# Patient Record
Sex: Female | Born: 1960 | Race: White | Hispanic: No | Marital: Single | Smoking: Never smoker
Health system: Southern US, Community
[De-identification: ages and names within clinical notes are randomized; demographics above are authoritative.]

## PROBLEM LIST (undated history)

## (undated) DIAGNOSIS — D649 Anemia, unspecified: Secondary | ICD-10-CM

## (undated) DIAGNOSIS — I10 Essential (primary) hypertension: Secondary | ICD-10-CM

## (undated) DIAGNOSIS — K219 Gastro-esophageal reflux disease without esophagitis: Secondary | ICD-10-CM

## (undated) HISTORY — DX: Anemia, unspecified: D64.9

## (undated) HISTORY — DX: Essential (primary) hypertension: I10

---

## 1989-01-30 HISTORY — PX: LUNG SURGERY: SHX703

## 2003-01-31 HISTORY — PX: ABDOMINAL HYSTERECTOMY: SHX81

## 2004-01-31 HISTORY — PX: BUNIONECTOMY: SHX129

## 2009-11-23 ENCOUNTER — Ambulatory Visit (HOSPITAL_COMMUNITY): Admission: RE | Admit: 2009-11-23 | Discharge: 2009-11-24 | Payer: Self-pay | Admitting: Neurosurgery

## 2010-01-30 HISTORY — PX: NECK SURGERY: SHX720

## 2010-04-13 LAB — SURGICAL PCR SCREEN
MRSA, PCR: NEGATIVE
Staphylococcus aureus: NEGATIVE

## 2010-04-13 LAB — BASIC METABOLIC PANEL
BUN: 9 mg/dL (ref 6–23)
CO2: 28 mEq/L (ref 19–32)
Calcium: 9.9 mg/dL (ref 8.4–10.5)
Chloride: 103 mEq/L (ref 96–112)
Creatinine, Ser: 0.75 mg/dL (ref 0.4–1.2)
GFR calc Af Amer: >60 mL/min (ref >60)
GFR calc non Af Amer: >60 mL/min (ref >60)
Glucose, Bld: 93 mg/dL (ref 70–99)
Potassium: 4.4 mEq/L (ref 3.5–5.1)
Sodium: 138 mEq/L (ref 135–145)

## 2010-04-13 LAB — CBC
HCT: 38 % (ref 36.0–46.0)
Hemoglobin: 12.9 g/dL (ref 12.0–15.0)
MCH: 29.3 pg (ref 26.0–34.0)
MCHC: 33.9 g/dL (ref 30.0–36.0)
MCV: 86.4 fL (ref 78.0–100.0)
Platelets: 266 10*3/uL (ref 150–400)
RBC: 4.4 MIL/uL (ref 3.87–5.11)
RDW: 13.1 % (ref 11.5–15.5)
WBC: 7 10*3/uL (ref 4.0–10.5)

## 2010-08-31 ENCOUNTER — Other Ambulatory Visit (HOSPITAL_COMMUNITY): Payer: Self-pay | Admitting: Neurosurgery

## 2010-08-31 DIAGNOSIS — M5020 Other cervical disc displacement, unspecified cervical region: Secondary | ICD-10-CM

## 2010-09-08 ENCOUNTER — Ambulatory Visit (HOSPITAL_COMMUNITY)
Admission: RE | Admit: 2010-09-08 | Discharge: 2010-09-08 | Disposition: A | Discharge status: Home / Self Care | Payer: Self-pay | Source: Ambulatory Visit | Attending: Neurosurgery | Admitting: Neurosurgery

## 2010-09-08 DIAGNOSIS — M5020 Other cervical disc displacement, unspecified cervical region: Secondary | ICD-10-CM | POA: Insufficient documentation

## 2010-09-21 ENCOUNTER — Encounter (HOSPITAL_COMMUNITY)
Admission: RE | Admit: 2010-09-21 | Discharge: 2010-09-21 | Disposition: A | Discharge status: Home / Self Care | Payer: BC Managed Care – PPO | Source: Ambulatory Visit | Attending: Neurosurgery | Admitting: Neurosurgery

## 2010-09-21 LAB — BASIC METABOLIC PANEL
BUN: 14 mg/dL (ref 6–23)
CO2: 30 mEq/L (ref 19–32)
Calcium: 9.5 mg/dL (ref 8.4–10.5)
Chloride: 103 mEq/L (ref 96–112)
Creatinine, Ser: 0.7 mg/dL (ref 0.50–1.10)
GFR calc Af Amer: >60 mL/min (ref >60)
GFR calc non Af Amer: >60 mL/min (ref >60)
Glucose, Bld: 89 mg/dL (ref 70–99)
Potassium: 4.7 mEq/L (ref 3.5–5.1)
Sodium: 139 mEq/L (ref 135–145)

## 2010-09-21 LAB — CBC
HCT: 38.5 % (ref 36.0–46.0)
Hemoglobin: 13.2 g/dL (ref 12.0–15.0)
MCH: 29.5 pg (ref 26.0–34.0)
MCHC: 34.3 g/dL (ref 30.0–36.0)
MCV: 86.1 fL (ref 78.0–100.0)
Platelets: 256 10*3/uL (ref 150–400)
RBC: 4.47 MIL/uL (ref 3.87–5.11)
RDW: 13.5 % (ref 11.5–15.5)
WBC: 7 10*3/uL (ref 4.0–10.5)

## 2010-09-21 LAB — SURGICAL PCR SCREEN
MRSA, PCR: NEGATIVE
Staphylococcus aureus: NEGATIVE

## 2010-09-30 ENCOUNTER — Ambulatory Visit (HOSPITAL_COMMUNITY): Payer: BC Managed Care – PPO

## 2010-09-30 ENCOUNTER — Ambulatory Visit (HOSPITAL_COMMUNITY)
Admission: RE | Admit: 2010-09-30 | Discharge: 2010-10-02 | DRG: 865 | Disposition: A | Discharge status: Home / Self Care | Payer: BC Managed Care – PPO | Source: Ambulatory Visit | Attending: Neurosurgery | Admitting: Neurosurgery

## 2010-09-30 DIAGNOSIS — Z01812 Encounter for preprocedural laboratory examination: Secondary | ICD-10-CM | POA: Insufficient documentation

## 2010-09-30 DIAGNOSIS — I10 Essential (primary) hypertension: Secondary | ICD-10-CM | POA: Insufficient documentation

## 2010-09-30 DIAGNOSIS — K219 Gastro-esophageal reflux disease without esophagitis: Secondary | ICD-10-CM | POA: Insufficient documentation

## 2010-09-30 DIAGNOSIS — T84498A Other mechanical complication of other internal orthopedic devices, implants and grafts, initial encounter: Secondary | ICD-10-CM | POA: Insufficient documentation

## 2010-09-30 DIAGNOSIS — Y838 Other surgical procedures as the cause of abnormal reaction of the patient, or of later complication, without mention of misadventure at the time of the procedure: Secondary | ICD-10-CM | POA: Insufficient documentation

## 2010-10-13 NOTE — Op Note (Signed)
Sheryl Reese, Sheryl Reese NO.:  [id number]  MEDICAL RECORD NO.:  [id number]  LOCATION:  3537                         FACILITY:  [facility]  PHYSICIAN:  Danae Orleans. Venetia Maxon, M.D.  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  PROCEDURES: 1. Posterior cervical fusion C4-C7 levels with lateral mass screws and     rods. 2. Posterolateral arthrodesis with BMP and allograft bone graft     material.  SURGEON:  Danae Orleans. Venetia Maxon, MD  ASSISTANT:  Georgiann Cocker, RN, and Phoebe Perch MD  ANESTHESIA:  General endotracheal anesthesia.  ESTIMATED BLOOD LOSS:  Minimal.  COMPLICATIONS:  None.  DISPOSITION:  Recovery.  INDICATIONS:  Sheryl Reese is a 50 year old woman who has previously undergone anterior cervical decompression and fusion at C4-C7 levels. She is found to have a nonunion on CT scan and was having significant neck pain and was elected to perform posterior cervical fusion and we selected to operate at these levels.  PROCEDURE IN DETAILS:  Sheryl Reese was brought to the operating room. Following satisfactory and uncomplicated induction of general endotracheal anesthesia plus intravenous lines, the patient was placed in 3 pinhead fixation, was placed in a prone position on the operating room table.  Her neck was maintained in neutral alignment, locked upon [place] head holder.  After padding soft tissue and bony prominences appropriately, her shoulders were wrapped, lower extremity were then taped with EBD padding to facilitate x-ray visualization.  The posterior neck was then prepped and draped in usual sterile fashion.  Prior to prepping we on inspecting  her skin, she was found to have a tick adherent to her posterior neck skin and this was removed with forceps. Her posterior neck was then prepped and draped in usual sterile fashion. The area of planned incision was infiltrated with local lidocaine. Incision was made, carried to the posterior cervical fascia which was divided bilaterally exposing the spinous processes and the lateral masses of C4, C5, C6 and C7 bilaterally.  Using arm the lateral masses were cleared of investing soft tissue and facet joints were corticated after correct orientation was determined with intraoperative fluoroscopy, 12 mm x 4.4 mm lateral mass screws were placed at C4, C5, C6, and C7 bilaterally.  Deception of the C5 on the right with small amount of bleeding and was elected to place a 10-mm screw at that level. All screws there appear to have excellent purchase and positions confirmed on lateral fluoroscopy.  The facet joint, laminar and spinous process complex has been extensively corticated, packed with small BMP and 5 mL of Vitoss bone pack soaked in the patient's blood.  The rods were cut and lordosed appropriately and affixed to the screw head and locked down in situ.  The fascia was then closed with 0 Vicryl sutures. Subcutaneous tissues were approximated with 2-0 Vicryl interrupted inverted sutures and skin edges were approximated with staples.  Sterile occlusive dressing was placed.  The patient was taken to recovery, able to tolerate the procedure well.  Several staples were placed in the scalp due to some persistent bleeding from a pin sites.  The patient was placed in a cervical collar, taken to recovery and tolerated the procedure well.     Danae Orleans. Venetia Maxon, M.D.     JDS/MEDQ  D:  09/30/2010  T:  09/30/2010  Job:  [id number]  Electronically Signed by Maeola Harman M.D. on 10/13/2010 02:47:09 PM

## 2010-11-10 NOTE — Discharge Summary (Signed)
  NAMENEALY, HICKMON NO.:  [id number]  MEDICAL RECORD NO.:  [id number]  LOCATION:  3038                         FACILITY:  [facility]  PHYSICIAN:  Danae Orleans. Venetia Maxon, M.D.  DATE OF BIRTH:  08/14/1960  DATE OF ADMISSION:  09/30/2010 DATE OF DISCHARGE:  10/02/2010                              DISCHARGE SUMMARY   REASON FOR ADMISSION:  Neck pain following anterior cervical decompression and fusion C4-C7 levels with evidence of nonunion on CT scan with persistent neck pain.  The patient was admitted to the hospital and underwent posterolateral arthrodesis with BMP and allograft bone graft material with lateral mass screws and rods C4-C7 levels.  The patient did well postoperatively, was gradually mobilized, did have some neck pain, was doing well, and was discharged home with instructions to follow up in 2 weeks postoperatively for staple removal.  Discharge medications include acyclovir, vitamin D3, vitamin B complex, losartan/hydrochlorothiazide 50/12.5 mg daily, omeprazole 20 mg daily, estradiol 1 mg daily, Benadryl 25 mg as needed with oxycodone for pain, and Valium for muscular spasm.     Danae Orleans. Venetia Maxon, M.D.     JDS/MEDQ  D:  11/08/2010  T:  11/09/2010  Job:  [id number]  Electronically Signed by Maeola Harman M.D. on 11/10/2010 09:36:54 AM

## 2015-05-12 DIAGNOSIS — L2389 Allergic contact dermatitis due to other agents: Secondary | ICD-10-CM | POA: Diagnosis not present

## 2015-05-12 DIAGNOSIS — M19031 Primary osteoarthritis, right wrist: Secondary | ICD-10-CM | POA: Diagnosis not present

## 2015-05-12 DIAGNOSIS — I10 Essential (primary) hypertension: Secondary | ICD-10-CM | POA: Diagnosis not present

## 2015-05-12 DIAGNOSIS — Z6832 Body mass index (BMI) 32.0-32.9, adult: Secondary | ICD-10-CM | POA: Diagnosis not present

## 2015-05-12 DIAGNOSIS — K210 Gastro-esophageal reflux disease with esophagitis: Secondary | ICD-10-CM | POA: Diagnosis not present

## 2015-06-15 DIAGNOSIS — K602 Anal fissure, unspecified: Secondary | ICD-10-CM | POA: Diagnosis not present

## 2015-06-20 DIAGNOSIS — K219 Gastro-esophageal reflux disease without esophagitis: Secondary | ICD-10-CM | POA: Diagnosis not present

## 2015-06-20 DIAGNOSIS — S60562A Insect bite (nonvenomous) of left hand, initial encounter: Secondary | ICD-10-CM | POA: Diagnosis not present

## 2015-06-20 DIAGNOSIS — L299 Pruritus, unspecified: Secondary | ICD-10-CM | POA: Diagnosis not present

## 2015-06-20 DIAGNOSIS — S40861A Insect bite (nonvenomous) of right upper arm, initial encounter: Secondary | ICD-10-CM | POA: Diagnosis not present

## 2015-07-13 DIAGNOSIS — Z6828 Body mass index (BMI) 28.0-28.9, adult: Secondary | ICD-10-CM | POA: Diagnosis not present

## 2015-07-13 DIAGNOSIS — K602 Anal fissure, unspecified: Secondary | ICD-10-CM | POA: Diagnosis not present

## 2015-08-10 DIAGNOSIS — Z6832 Body mass index (BMI) 32.0-32.9, adult: Secondary | ICD-10-CM | POA: Diagnosis not present

## 2015-08-10 DIAGNOSIS — S51811A Laceration without foreign body of right forearm, initial encounter: Secondary | ICD-10-CM | POA: Diagnosis not present

## 2015-08-10 DIAGNOSIS — K210 Gastro-esophageal reflux disease with esophagitis: Secondary | ICD-10-CM | POA: Diagnosis not present

## 2015-08-10 DIAGNOSIS — I10 Essential (primary) hypertension: Secondary | ICD-10-CM | POA: Diagnosis not present

## 2015-08-11 DIAGNOSIS — I10 Essential (primary) hypertension: Secondary | ICD-10-CM | POA: Diagnosis not present

## 2015-08-11 DIAGNOSIS — Z131 Encounter for screening for diabetes mellitus: Secondary | ICD-10-CM | POA: Diagnosis not present

## 2015-10-05 DIAGNOSIS — Z23 Encounter for immunization: Secondary | ICD-10-CM | POA: Diagnosis not present

## 2015-11-11 DIAGNOSIS — K210 Gastro-esophageal reflux disease with esophagitis: Secondary | ICD-10-CM | POA: Diagnosis not present

## 2015-11-11 DIAGNOSIS — I10 Essential (primary) hypertension: Secondary | ICD-10-CM | POA: Diagnosis not present

## 2015-11-11 DIAGNOSIS — Z6828 Body mass index (BMI) 28.0-28.9, adult: Secondary | ICD-10-CM | POA: Diagnosis not present

## 2015-11-26 DIAGNOSIS — Z6829 Body mass index (BMI) 29.0-29.9, adult: Secondary | ICD-10-CM | POA: Diagnosis not present

## 2015-11-26 DIAGNOSIS — K602 Anal fissure, unspecified: Secondary | ICD-10-CM | POA: Diagnosis not present

## 2015-11-26 DIAGNOSIS — K6289 Other specified diseases of anus and rectum: Secondary | ICD-10-CM | POA: Diagnosis not present

## 2015-12-03 DIAGNOSIS — K644 Residual hemorrhoidal skin tags: Secondary | ICD-10-CM | POA: Diagnosis not present

## 2015-12-03 DIAGNOSIS — Z6829 Body mass index (BMI) 29.0-29.9, adult: Secondary | ICD-10-CM | POA: Diagnosis not present

## 2016-02-10 DIAGNOSIS — I10 Essential (primary) hypertension: Secondary | ICD-10-CM | POA: Diagnosis not present

## 2016-02-10 DIAGNOSIS — K210 Gastro-esophageal reflux disease with esophagitis: Secondary | ICD-10-CM | POA: Diagnosis not present

## 2016-02-10 DIAGNOSIS — Z6828 Body mass index (BMI) 28.0-28.9, adult: Secondary | ICD-10-CM | POA: Diagnosis not present

## 2016-03-08 DIAGNOSIS — Z6829 Body mass index (BMI) 29.0-29.9, adult: Secondary | ICD-10-CM | POA: Diagnosis not present

## 2016-03-08 DIAGNOSIS — Z1272 Encounter for screening for malignant neoplasm of vagina: Secondary | ICD-10-CM | POA: Diagnosis not present

## 2016-03-08 DIAGNOSIS — Z01419 Encounter for gynecological examination (general) (routine) without abnormal findings: Secondary | ICD-10-CM | POA: Diagnosis not present

## 2016-03-28 DIAGNOSIS — Z1231 Encounter for screening mammogram for malignant neoplasm of breast: Secondary | ICD-10-CM | POA: Diagnosis not present

## 2016-04-13 DIAGNOSIS — H40013 Open angle with borderline findings, low risk, bilateral: Secondary | ICD-10-CM | POA: Diagnosis not present

## 2016-05-01 DIAGNOSIS — M25579 Pain in unspecified ankle and joints of unspecified foot: Secondary | ICD-10-CM | POA: Diagnosis not present

## 2016-05-01 DIAGNOSIS — M79672 Pain in left foot: Secondary | ICD-10-CM | POA: Diagnosis not present

## 2016-05-01 DIAGNOSIS — M79671 Pain in right foot: Secondary | ICD-10-CM | POA: Diagnosis not present

## 2016-05-09 DIAGNOSIS — Z6828 Body mass index (BMI) 28.0-28.9, adult: Secondary | ICD-10-CM | POA: Diagnosis not present

## 2016-05-09 DIAGNOSIS — I10 Essential (primary) hypertension: Secondary | ICD-10-CM | POA: Diagnosis not present

## 2016-05-09 DIAGNOSIS — K210 Gastro-esophageal reflux disease with esophagitis: Secondary | ICD-10-CM | POA: Diagnosis not present

## 2016-06-17 DIAGNOSIS — L237 Allergic contact dermatitis due to plants, except food: Secondary | ICD-10-CM | POA: Diagnosis not present

## 2016-06-28 DIAGNOSIS — Z23 Encounter for immunization: Secondary | ICD-10-CM | POA: Diagnosis not present

## 2016-07-03 DIAGNOSIS — W57XXXA Bitten or stung by nonvenomous insect and other nonvenomous arthropods, initial encounter: Secondary | ICD-10-CM | POA: Diagnosis not present

## 2016-07-03 DIAGNOSIS — K5900 Constipation, unspecified: Secondary | ICD-10-CM | POA: Diagnosis not present

## 2016-08-01 DIAGNOSIS — K210 Gastro-esophageal reflux disease with esophagitis: Secondary | ICD-10-CM | POA: Diagnosis not present

## 2016-08-01 DIAGNOSIS — I10 Essential (primary) hypertension: Secondary | ICD-10-CM | POA: Diagnosis not present

## 2016-08-01 DIAGNOSIS — Z6828 Body mass index (BMI) 28.0-28.9, adult: Secondary | ICD-10-CM | POA: Diagnosis not present

## 2016-08-08 DIAGNOSIS — Z131 Encounter for screening for diabetes mellitus: Secondary | ICD-10-CM | POA: Diagnosis not present

## 2016-08-08 DIAGNOSIS — I10 Essential (primary) hypertension: Secondary | ICD-10-CM | POA: Diagnosis not present

## 2016-08-08 DIAGNOSIS — Z79899 Other long term (current) drug therapy: Secondary | ICD-10-CM | POA: Diagnosis not present

## 2016-08-21 DIAGNOSIS — M79672 Pain in left foot: Secondary | ICD-10-CM | POA: Diagnosis not present

## 2016-08-21 DIAGNOSIS — M25579 Pain in unspecified ankle and joints of unspecified foot: Secondary | ICD-10-CM | POA: Diagnosis not present

## 2016-08-21 DIAGNOSIS — M79671 Pain in right foot: Secondary | ICD-10-CM | POA: Diagnosis not present

## 2016-10-17 DIAGNOSIS — Z23 Encounter for immunization: Secondary | ICD-10-CM | POA: Diagnosis not present

## 2016-11-02 DIAGNOSIS — E785 Hyperlipidemia, unspecified: Secondary | ICD-10-CM | POA: Diagnosis not present

## 2016-11-02 DIAGNOSIS — K210 Gastro-esophageal reflux disease with esophagitis: Secondary | ICD-10-CM | POA: Diagnosis not present

## 2016-11-02 DIAGNOSIS — Z6828 Body mass index (BMI) 28.0-28.9, adult: Secondary | ICD-10-CM | POA: Diagnosis not present

## 2016-11-02 DIAGNOSIS — I10 Essential (primary) hypertension: Secondary | ICD-10-CM | POA: Diagnosis not present

## 2016-12-28 DIAGNOSIS — M79671 Pain in right foot: Secondary | ICD-10-CM | POA: Diagnosis not present

## 2016-12-28 DIAGNOSIS — G5750 Tarsal tunnel syndrome, unspecified lower limb: Secondary | ICD-10-CM | POA: Diagnosis not present

## 2016-12-28 DIAGNOSIS — M2011 Hallux valgus (acquired), right foot: Secondary | ICD-10-CM | POA: Diagnosis not present

## 2017-02-01 DIAGNOSIS — Z6828 Body mass index (BMI) 28.0-28.9, adult: Secondary | ICD-10-CM | POA: Diagnosis not present

## 2017-02-01 DIAGNOSIS — I10 Essential (primary) hypertension: Secondary | ICD-10-CM | POA: Diagnosis not present

## 2017-02-01 DIAGNOSIS — K210 Gastro-esophageal reflux disease with esophagitis: Secondary | ICD-10-CM | POA: Diagnosis not present

## 2017-02-01 DIAGNOSIS — E7849 Other hyperlipidemia: Secondary | ICD-10-CM | POA: Diagnosis not present

## 2017-03-14 DIAGNOSIS — Z01419 Encounter for gynecological examination (general) (routine) without abnormal findings: Secondary | ICD-10-CM | POA: Diagnosis not present

## 2017-03-14 DIAGNOSIS — Z6829 Body mass index (BMI) 29.0-29.9, adult: Secondary | ICD-10-CM | POA: Diagnosis not present

## 2017-04-10 DIAGNOSIS — Z1231 Encounter for screening mammogram for malignant neoplasm of breast: Secondary | ICD-10-CM | POA: Diagnosis not present

## 2017-05-01 DIAGNOSIS — H40053 Ocular hypertension, bilateral: Secondary | ICD-10-CM | POA: Diagnosis not present

## 2017-05-02 DIAGNOSIS — E7849 Other hyperlipidemia: Secondary | ICD-10-CM | POA: Diagnosis not present

## 2017-05-02 DIAGNOSIS — K210 Gastro-esophageal reflux disease with esophagitis: Secondary | ICD-10-CM | POA: Diagnosis not present

## 2017-05-02 DIAGNOSIS — Z6828 Body mass index (BMI) 28.0-28.9, adult: Secondary | ICD-10-CM | POA: Diagnosis not present

## 2017-05-02 DIAGNOSIS — I10 Essential (primary) hypertension: Secondary | ICD-10-CM | POA: Diagnosis not present

## 2017-05-10 DIAGNOSIS — G5750 Tarsal tunnel syndrome, unspecified lower limb: Secondary | ICD-10-CM | POA: Diagnosis not present

## 2017-05-10 DIAGNOSIS — M79672 Pain in left foot: Secondary | ICD-10-CM | POA: Diagnosis not present

## 2017-07-31 DIAGNOSIS — Z0000 Encounter for general adult medical examination without abnormal findings: Secondary | ICD-10-CM | POA: Diagnosis not present

## 2017-07-31 DIAGNOSIS — Z6831 Body mass index (BMI) 31.0-31.9, adult: Secondary | ICD-10-CM | POA: Diagnosis not present

## 2017-08-16 DIAGNOSIS — M79672 Pain in left foot: Secondary | ICD-10-CM | POA: Diagnosis not present

## 2017-08-16 DIAGNOSIS — M25571 Pain in right ankle and joints of right foot: Secondary | ICD-10-CM | POA: Diagnosis not present

## 2017-08-30 DIAGNOSIS — Z780 Asymptomatic menopausal state: Secondary | ICD-10-CM | POA: Diagnosis not present

## 2017-08-30 DIAGNOSIS — M8588 Other specified disorders of bone density and structure, other site: Secondary | ICD-10-CM | POA: Diagnosis not present

## 2017-08-30 DIAGNOSIS — M810 Age-related osteoporosis without current pathological fracture: Secondary | ICD-10-CM | POA: Diagnosis not present

## 2017-10-24 DIAGNOSIS — Z23 Encounter for immunization: Secondary | ICD-10-CM | POA: Diagnosis not present

## 2017-10-31 DIAGNOSIS — I10 Essential (primary) hypertension: Secondary | ICD-10-CM | POA: Diagnosis not present

## 2017-10-31 DIAGNOSIS — Z6831 Body mass index (BMI) 31.0-31.9, adult: Secondary | ICD-10-CM | POA: Diagnosis not present

## 2017-10-31 DIAGNOSIS — E7849 Other hyperlipidemia: Secondary | ICD-10-CM | POA: Diagnosis not present

## 2017-10-31 DIAGNOSIS — K210 Gastro-esophageal reflux disease with esophagitis: Secondary | ICD-10-CM | POA: Diagnosis not present

## 2018-02-01 DIAGNOSIS — M25673 Stiffness of unspecified ankle, not elsewhere classified: Secondary | ICD-10-CM | POA: Diagnosis not present

## 2018-02-01 DIAGNOSIS — E7849 Other hyperlipidemia: Secondary | ICD-10-CM | POA: Diagnosis not present

## 2018-02-01 DIAGNOSIS — I10 Essential (primary) hypertension: Secondary | ICD-10-CM | POA: Diagnosis not present

## 2018-02-01 DIAGNOSIS — Z6831 Body mass index (BMI) 31.0-31.9, adult: Secondary | ICD-10-CM | POA: Diagnosis not present

## 2018-02-01 DIAGNOSIS — K210 Gastro-esophageal reflux disease with esophagitis: Secondary | ICD-10-CM | POA: Diagnosis not present

## 2018-02-25 DIAGNOSIS — K602 Anal fissure, unspecified: Secondary | ICD-10-CM | POA: Diagnosis not present

## 2018-02-25 DIAGNOSIS — Z6829 Body mass index (BMI) 29.0-29.9, adult: Secondary | ICD-10-CM | POA: Diagnosis not present

## 2018-03-18 DIAGNOSIS — Z6830 Body mass index (BMI) 30.0-30.9, adult: Secondary | ICD-10-CM | POA: Diagnosis not present

## 2018-03-18 DIAGNOSIS — Z01419 Encounter for gynecological examination (general) (routine) without abnormal findings: Secondary | ICD-10-CM | POA: Diagnosis not present

## 2018-03-18 DIAGNOSIS — K602 Anal fissure, unspecified: Secondary | ICD-10-CM | POA: Diagnosis not present

## 2018-04-15 DIAGNOSIS — Z1231 Encounter for screening mammogram for malignant neoplasm of breast: Secondary | ICD-10-CM | POA: Diagnosis not present

## 2018-05-02 DIAGNOSIS — K210 Gastro-esophageal reflux disease with esophagitis: Secondary | ICD-10-CM | POA: Diagnosis not present

## 2018-05-02 DIAGNOSIS — E7849 Other hyperlipidemia: Secondary | ICD-10-CM | POA: Diagnosis not present

## 2018-05-02 DIAGNOSIS — Z6831 Body mass index (BMI) 31.0-31.9, adult: Secondary | ICD-10-CM | POA: Diagnosis not present

## 2018-05-02 DIAGNOSIS — I10 Essential (primary) hypertension: Secondary | ICD-10-CM | POA: Diagnosis not present

## 2018-08-01 DIAGNOSIS — Z6832 Body mass index (BMI) 32.0-32.9, adult: Secondary | ICD-10-CM | POA: Diagnosis not present

## 2018-08-01 DIAGNOSIS — K210 Gastro-esophageal reflux disease with esophagitis: Secondary | ICD-10-CM | POA: Diagnosis not present

## 2018-08-01 DIAGNOSIS — I10 Essential (primary) hypertension: Secondary | ICD-10-CM | POA: Diagnosis not present

## 2018-08-01 DIAGNOSIS — E7849 Other hyperlipidemia: Secondary | ICD-10-CM | POA: Diagnosis not present

## 2018-10-09 DIAGNOSIS — Z23 Encounter for immunization: Secondary | ICD-10-CM | POA: Diagnosis not present

## 2018-11-05 DIAGNOSIS — Z0000 Encounter for general adult medical examination without abnormal findings: Secondary | ICD-10-CM | POA: Diagnosis not present

## 2018-11-05 DIAGNOSIS — Z6832 Body mass index (BMI) 32.0-32.9, adult: Secondary | ICD-10-CM | POA: Diagnosis not present

## 2019-01-21 DIAGNOSIS — H40053 Ocular hypertension, bilateral: Secondary | ICD-10-CM | POA: Diagnosis not present

## 2019-02-21 ENCOUNTER — Other Ambulatory Visit (HOSPITAL_COMMUNITY): Payer: Self-pay | Admitting: Unknown Physician Specialty

## 2019-02-21 DIAGNOSIS — Z1231 Encounter for screening mammogram for malignant neoplasm of breast: Secondary | ICD-10-CM

## 2019-04-08 ENCOUNTER — Telehealth: Payer: Self-pay | Admitting: Obstetrics and Gynecology

## 2019-04-08 NOTE — Telephone Encounter (Signed)

## 2019-04-09 ENCOUNTER — Encounter: Payer: Self-pay | Admitting: Obstetrics and Gynecology

## 2019-04-09 ENCOUNTER — Ambulatory Visit (INDEPENDENT_AMBULATORY_CARE_PROVIDER_SITE_OTHER): Payer: 59 | Admitting: Obstetrics and Gynecology

## 2019-04-09 ENCOUNTER — Other Ambulatory Visit: Payer: Self-pay

## 2019-04-09 VITALS — BP 125/72 | HR 79 | Ht 70.0 in | Wt 213.2 lb

## 2019-04-09 DIAGNOSIS — Z01419 Encounter for gynecological examination (general) (routine) without abnormal findings: Secondary | ICD-10-CM

## 2019-04-09 NOTE — Progress Notes (Signed)
Patient ID: Sheryl Reese, female   DOB: 11-Oct-1960, 59 y.o.   MRN: [id number]   Assessment:  Annual Gyn Exam Plan:   1. Return in five years or prn 2. Annual mammogram advised after age 59 Subjective:  Sheryl Reese is a 59 y.o. female No obstetric history on file. who presents for annual exam. No LMP recorded. Patient has had a hysterectomy. The patient has been under the care of Dr. Barrie Dunker for several years but her insurance no longer covers his services. The patient has had a total hysterectomy but is no longer on any hormone treatment. The patient has had four rectal skin tags removed in the past and states that she has a small one remaining that does not bother her. The patient has no complaints today.  The following portions of the patient's history were reviewed and updated as appropriate: allergies, current medications, past family history, past medical history, past social history, past surgical history and problem list. Past Medical History:  Diagnosis Date  . Anemia   . Hypertension     Past Surgical History:  Procedure Laterality Date  . ABDOMINAL HYSTERECTOMY  2005   complete hysterectomy   . BUNIONECTOMY  2006   BUNION REMOVED   . LUNG SURGERY  1991   cyst on lung removed   . LUNG SURGERY  1991   CYST REMOVED   . NECK SURGERY  2012   ROD PLACED IN NECK      Current Outpatient Medications:  .  calcium-vitamin D (OSCAL WITH D) 500-200 MG-UNIT tablet, Take 1 tablet by mouth., Disp: , Rfl:  .  celecoxib (CELEBREX) 200 MG capsule, Take 200 mg by mouth 2 (two) times daily., Disp: , Rfl:  .  hydrochlorothiazide (HYDRODIURIL) 12.5 MG tablet, Take 12.5 mg by mouth daily., Disp: , Rfl:  .  losartan (COZAAR) 50 MG tablet, Take 50 mg by mouth daily., Disp: , Rfl:  .  omeprazole (PRILOSEC) 20 MG capsule, Take 20 mg by mouth daily., Disp: , Rfl:   Review of Systems Constitutional: negative Gastrointestinal: negative Genitourinary: Negative  Objective:  BP 125/72 (BP  Location: Right Arm, Patient Position: Sitting, Cuff Size: Normal)   Pulse 79   Ht 5\' 10"  (1.778 m)   Wt 213 lb 3.2 oz (96.7 kg)   BMI 30.59 kg/m    BMI: Body mass index is 30.59 kg/m.  General Appearance: Alert, appropriate appearance for age. No acute distress HEENT: Grossly normal Breast Exam: No dimpling, nipple retraction or discharge. No masses or nodes., Normal to inspection and Normal breast tissue bilaterally Gastrointestinal: Soft, non-tender, no masses or organomegaly Pelvic Exam: External genitalia: normal general appearance Vaginal: normal mucosa without prolapse or lesions Cervix: removed surgically Uterus: removed surgically Rectal: good sphincter tone, no masses and guaiac negative Skin: no rash or abnormalities Neurologic: Normal gait and speech, no tremor  Psychiatric: Alert and oriented, appropriate affect.  Urinalysis:Not done  By signing my name below, I, Clerance Lav, attest that this documentation has been prepared under the direction and in the presence of Jonnie Kind, MD. Electronically Signed: [place]. 04/09/19. 4:09 PM.  I personally performed the services described in this documentation, which was SCRIBED in my presence. The recorded information has been reviewed and considered accurate. It has been edited as necessary during review. Jonnie Kind, MD

## 2019-05-05 ENCOUNTER — Other Ambulatory Visit: Payer: Self-pay

## 2019-05-05 ENCOUNTER — Ambulatory Visit (HOSPITAL_COMMUNITY)
Admission: RE | Admit: 2019-05-05 | Discharge: 2019-05-05 | Disposition: A | Discharge status: Home / Self Care | Payer: 59 | Source: Ambulatory Visit | Attending: Unknown Physician Specialty | Admitting: Unknown Physician Specialty

## 2019-05-05 DIAGNOSIS — Z1231 Encounter for screening mammogram for malignant neoplasm of breast: Secondary | ICD-10-CM | POA: Insufficient documentation

## 2019-07-10 ENCOUNTER — Other Ambulatory Visit (HOSPITAL_COMMUNITY)
Admission: RE | Admit: 2019-07-10 | Discharge: 2019-07-10 | Disposition: A | Discharge status: Home / Self Care | Payer: 59 | Source: Ambulatory Visit | Attending: Internal Medicine | Admitting: Internal Medicine

## 2019-07-10 ENCOUNTER — Other Ambulatory Visit (HOSPITAL_COMMUNITY): Payer: Self-pay | Admitting: Internal Medicine

## 2019-07-10 ENCOUNTER — Other Ambulatory Visit: Payer: Self-pay

## 2019-07-10 ENCOUNTER — Ambulatory Visit (HOSPITAL_COMMUNITY)
Admission: RE | Admit: 2019-07-10 | Discharge: 2019-07-10 | Disposition: A | Discharge status: Home / Self Care | Payer: 59 | Source: Ambulatory Visit | Attending: Internal Medicine | Admitting: Internal Medicine

## 2019-07-10 DIAGNOSIS — M25562 Pain in left knee: Secondary | ICD-10-CM | POA: Diagnosis present

## 2019-07-10 LAB — D-DIMER, QUANTITATIVE: D-Dimer, Quant: 0.39 ug/mL-FEU (ref 0.00–0.50)

## 2019-11-06 ENCOUNTER — Other Ambulatory Visit (HOSPITAL_COMMUNITY): Payer: Self-pay | Admitting: Internal Medicine

## 2019-11-06 DIAGNOSIS — M810 Age-related osteoporosis without current pathological fracture: Secondary | ICD-10-CM

## 2019-12-10 ENCOUNTER — Ambulatory Visit (HOSPITAL_COMMUNITY)
Admission: RE | Admit: 2019-12-10 | Discharge: 2019-12-10 | Disposition: A | Discharge status: Home / Self Care | Payer: 59 | Source: Ambulatory Visit | Attending: Internal Medicine | Admitting: Internal Medicine

## 2019-12-10 ENCOUNTER — Other Ambulatory Visit: Payer: Self-pay

## 2019-12-10 DIAGNOSIS — M810 Age-related osteoporosis without current pathological fracture: Secondary | ICD-10-CM | POA: Diagnosis not present

## 2020-03-26 ENCOUNTER — Other Ambulatory Visit (HOSPITAL_COMMUNITY): Payer: Self-pay | Admitting: Internal Medicine

## 2020-03-26 DIAGNOSIS — Z1231 Encounter for screening mammogram for malignant neoplasm of breast: Secondary | ICD-10-CM

## 2020-04-10 ENCOUNTER — Other Ambulatory Visit: Payer: Self-pay

## 2020-04-10 ENCOUNTER — Encounter: Payer: Self-pay | Admitting: Emergency Medicine

## 2020-04-10 ENCOUNTER — Ambulatory Visit
Admission: EM | Admit: 2020-04-10 | Discharge: 2020-04-10 | Disposition: A | Discharge status: Home / Self Care | Payer: 59 | Attending: Emergency Medicine | Admitting: Emergency Medicine

## 2020-04-10 DIAGNOSIS — L237 Allergic contact dermatitis due to plants, except food: Secondary | ICD-10-CM

## 2020-04-10 MED ORDER — DEXAMETHASONE SODIUM PHOSPHATE 10 MG/ML IJ SOLN
10.0000 mg | Freq: Once | INTRAMUSCULAR | Status: AC
  Administered 2020-04-10: 10 mg via INTRAMUSCULAR

## 2020-04-10 MED ORDER — PREDNISONE 10 MG PO TABS: 20.0000 mg | ORAL_TABLET | Freq: Every day | ORAL | 0 refills | Status: DC

## 2020-04-10 MED ORDER — CETIRIZINE HCL 10 MG PO TABS: 10.0000 mg | ORAL_TABLET | Freq: Every day | ORAL | 0 refills | Status: DC

## 2020-04-10 NOTE — ED Triage Notes (Signed)
Poison oak on bilateral arms since Thursday

## 2020-04-10 NOTE — ED Provider Notes (Signed)
[place]   [id number] 04/10/20 Arrival Time: 57   Chief Complaint  Patient presents with  . Rash     SUBJECTIVE: History from: patient.  Sheryl Reese is a 60 y.o. female who presented to the urgent care for complaint of rash that started this past Thursday.  She denies changes in soaps, detergents, or anyone with similar symptoms.  Localized the rash to bilateral arm.  She describes it as red and itchy.  Has tried OTC medication without relief.  Denies alleviating or aggravating factor.report similar symptoms in the past.  Denies chills, fever, nausea, vomiting, diarrhea. .   ROS: As per HPI.  All other pertinent ROS negative.      Past Medical History:  Diagnosis Date  . Anemia   . Hypertension    Past Surgical History:  Procedure Laterality Date  . ABDOMINAL HYSTERECTOMY  2005   complete hysterectomy   . BUNIONECTOMY  2006   BUNION REMOVED   . LUNG SURGERY  1991   cyst on lung removed   . LUNG SURGERY  1991   CYST REMOVED   . NECK SURGERY  2012   ROD PLACED IN NECK    Allergies  Allergen Reactions  . Nalbuphine Anaphylaxis and Other (See Comments)   No current facility-administered medications on file prior to encounter.   Current Outpatient Medications on File Prior to Encounter  Medication Sig Dispense Refill  . calcium-vitamin D (OSCAL WITH D) 500-200 MG-UNIT tablet Take 1 tablet by mouth.    . celecoxib (CELEBREX) 200 MG capsule Take 200 mg by mouth 2 (two) times daily.    . hydrochlorothiazide (HYDRODIURIL) 12.5 MG tablet Take 12.5 mg by mouth daily.    Marland Kitchen losartan (COZAAR) 50 MG tablet Take 50 mg by mouth daily.    Marland Kitchen omeprazole (PRILOSEC) 20 MG capsule Take 20 mg by mouth daily.     Social History   Socioeconomic History  . Marital status: Single    Spouse name: Not on file  . Number of children: Not on file  . Years of education: Not on file  . Highest education level: Not on file  Occupational History  . Not on file  Tobacco Use   . Smoking status: Never Smoker  . Smokeless tobacco: Never Used  Vaping Use  . Vaping Use: Never used  Substance and Sexual Activity  . Alcohol use: Yes    Alcohol/week: 3.0 standard drinks    Types: 3 Cans of beer per week  . Drug use: Never  . Sexual activity: Not on file  Other Topics Concern  . Not on file  Social History Narrative  . Not on file   Social Determinants of Health   Financial Resource Strain: Not on file  Food Insecurity: Not on file  Transportation Needs: Not on file  Physical Activity: Not on file  Stress: Not on file  Social Connections: Not on file  Intimate Partner Violence: Not on file   Family History  Problem Relation Age of Onset  . Colon cancer Maternal Grandmother   . Hypertension Maternal Grandfather   . Hypertension Mother   . Hypertension Sister     OBJECTIVE:  Vitals:   04/10/20 1124  BP: (!) 153/95  Pulse: 83  Resp: 18  Temp: 98.2 F (36.8 C)  TempSrc: Oral  SpO2: 95%     Physical Exam Vitals and nursing note reviewed.  Constitutional:      General: She is not in acute distress.  Appearance: Normal appearance. She is normal weight. She is not ill-appearing, toxic-appearing or diaphoretic.  HENT:     Head: Normocephalic.  Cardiovascular:     Rate and Rhythm: Normal rate and regular rhythm.     Pulses: Normal pulses.     Heart sounds: Normal heart sounds. No murmur heard. No friction rub. No gallop.   Pulmonary:     Effort: Pulmonary effort is normal. No respiratory distress.     Breath sounds: Normal breath sounds. No stridor. No wheezing, rhonchi or rales.  Chest:     Chest wall: No tenderness.  Skin:    General: Skin is warm.     Findings: Rash present. Rash is macular.  Neurological:     Mental Status: She is alert and oriented to person, place, and time.      LABS:  No results found for this or any previous visit (from the past 24 hour(s)).   ASSESSMENT & PLAN:  1. Poison oak dermatitis     Meds  ordered this encounter  Medications  . dexamethasone (DECADRON) injection 10 mg  . predniSONE (DELTASONE) 10 MG tablet    Sig: Take 2 tablets (20 mg total) by mouth daily.    Dispense:  15 tablet    Refill:  0  . cetirizine (ZYRTEC ALLERGY) 10 MG tablet    Sig: Take 1 tablet (10 mg total) by mouth daily.    Dispense:  30 tablet    Refill:  0    Discharge instructions  Decadron IM was given in office Prescribed zyrtec and prednisone Take as prescribed and to completion Limit hot shower and baths, or bathe with warm water.   Moisturize skin daily Follow up with PCP if symptoms persists Return or go to the ER if you have any new or worsening symptoms    Reviewed expectations re: course of current medical issues. Questions answered. Outlined signs and symptoms indicating need for more acute intervention. Patient verbalized understanding. After Visit Summary given.         Emerson Monte, [place] 04/10/20 1131

## 2020-04-10 NOTE — Discharge Instructions (Addendum)
Decadron IM was given in office Prescribed zyrtec and prednisone Take as prescribed and to completion Limit hot shower and baths, or bathe with warm water.   Moisturize skin daily Follow up with PCP if symptoms persists Return or go to the ER if you have any new or worsening symptoms

## 2020-04-17 ENCOUNTER — Other Ambulatory Visit: Payer: Self-pay

## 2020-04-17 ENCOUNTER — Encounter: Payer: Self-pay | Admitting: Emergency Medicine

## 2020-04-17 ENCOUNTER — Ambulatory Visit
Admission: EM | Admit: 2020-04-17 | Discharge: 2020-04-17 | Disposition: A | Discharge status: Home / Self Care | Payer: 59 | Attending: Emergency Medicine | Admitting: Emergency Medicine

## 2020-04-17 DIAGNOSIS — L299 Pruritus, unspecified: Secondary | ICD-10-CM

## 2020-04-17 DIAGNOSIS — L237 Allergic contact dermatitis due to plants, except food: Secondary | ICD-10-CM | POA: Diagnosis not present

## 2020-04-17 MED ORDER — DEXAMETHASONE SODIUM PHOSPHATE 10 MG/ML IJ SOLN
10.0000 mg | Freq: Once | INTRAMUSCULAR | Status: AC
  Administered 2020-04-17: 10 mg via INTRAMUSCULAR

## 2020-04-17 MED ORDER — PREDNISONE 10 MG (21) PO TBPK: ORAL_TABLET | Freq: Every day | ORAL | 0 refills | Status: DC

## 2020-04-17 NOTE — Discharge Instructions (Signed)
Wash with warm water and mild soap Steroid shot given in office Prednisone prescribed.  Take as directed and to completion Use OTC zyrtec, allegra, or claritin during the day.  Benadryl at night. You may also use OTC hydrocortisone cream and/or calamine lotion to help alleviate itching Follow up with PCP if symptoms persists  Return or go to the ED if you have any new or worsening symptoms such as fever, chills, nausea, vomiting, difficulty breathing, throat swelling, tongue swelling, numbness/ tingling in mouth, worsening symptoms despite treatment, etc..Marland Kitchen

## 2020-04-17 NOTE — ED Provider Notes (Signed)
[place]   [id number] 04/17/20 Arrival Time: 4098  CC: Rash  SUBJECTIVE:  Sheryl Reese is a 60 y.o. female who presents with a poison ivy rash >1 week.  Came into contact with poison ivy.  Localizes the rash to bilateral arms.  Describes it as itchy, red, and spreading.  Has tried steroid shot and prednisone with temporary relief.  Symptoms are made worse with scratching.   Denies fever, chills, nausea, vomiting, erythema, swelling, discharge, oral lesions, SOB, chest pain, abdominal pain, changes in bowel or bladder function.    ROS: As per HPI.  All other pertinent ROS negative.     Past Medical History:  Diagnosis Date  . Anemia   . Hypertension    Past Surgical History:  Procedure Laterality Date  . ABDOMINAL HYSTERECTOMY  2005   complete hysterectomy   . BUNIONECTOMY  2006   BUNION REMOVED   . LUNG SURGERY  1991   cyst on lung removed   . LUNG SURGERY  1991   CYST REMOVED   . NECK SURGERY  2012   ROD PLACED IN NECK    Allergies  Allergen Reactions  . Nalbuphine Anaphylaxis and Other (See Comments)   No current facility-administered medications on file prior to encounter.   Current Outpatient Medications on File Prior to Encounter  Medication Sig Dispense Refill  . calcium-vitamin D (OSCAL WITH D) 500-200 MG-UNIT tablet Take 1 tablet by mouth.    . celecoxib (CELEBREX) 200 MG capsule Take 200 mg by mouth 2 (two) times daily.    . cetirizine (ZYRTEC ALLERGY) 10 MG tablet Take 1 tablet (10 mg total) by mouth daily. 30 tablet 0  . hydrochlorothiazide (HYDRODIURIL) 12.5 MG tablet Take 12.5 mg by mouth daily.    Marland Kitchen losartan (COZAAR) 50 MG tablet Take 50 mg by mouth daily.    Marland Kitchen omeprazole (PRILOSEC) 20 MG capsule Take 20 mg by mouth daily.     Social History   Socioeconomic History  . Marital status: Single    Spouse name: Not on file  . Number of children: Not on file  . Years of education: Not on file  . Highest education level: Not on file   Occupational History  . Not on file  Tobacco Use  . Smoking status: Never Smoker  . Smokeless tobacco: Never Used  Vaping Use  . Vaping Use: Never used  Substance and Sexual Activity  . Alcohol use: Yes    Alcohol/week: 3.0 standard drinks    Types: 3 Cans of beer per week  . Drug use: Never  . Sexual activity: Not on file  Other Topics Concern  . Not on file  Social History Narrative  . Not on file   Social Determinants of Health   Financial Resource Strain: Not on file  Food Insecurity: Not on file  Transportation Needs: Not on file  Physical Activity: Not on file  Stress: Not on file  Social Connections: Not on file  Intimate Partner Violence: Not on file   Family History  Problem Relation Age of Onset  . Colon cancer Maternal Grandmother   . Hypertension Maternal Grandfather   . Hypertension Mother   . Hypertension Sister     OBJECTIVE: Vitals:   04/17/20 1303  BP: 140/87  Pulse: 95  Resp: 16  Temp: 97.6 F (36.4 C)  TempSrc: Tympanic  SpO2: 96%    General appearance: alert; no distress Head: NCAT Lungs: normal respiratory effort Extremities: no edema Skin: warm  and dry; areas of linear papules and vesicles with surrounding erythema to bilateral forearms, some manifestations to LT side Psychological: alert and cooperative; normal mood and affect  ASSESSMENT & PLAN:  1. Poison ivy   2. Itching     Meds ordered this encounter  Medications  . predniSONE (STERAPRED UNI-PAK 21 TAB) 10 MG (21) TBPK tablet    Sig: Take by mouth daily. Take 6 tabs by mouth daily  for 2 days, then 5 tabs for 2 days, then 4 tabs for 2 days, then 3 tabs for 2 days, 2 tabs for 2 days, then 1 tab by mouth daily for 2 days    Dispense:  42 tablet    Refill:  0    Order Specific Question:   Supervising Provider    Answer:   Raylene Everts [[id number]]  . dexamethasone (DECADRON) injection 10 mg    Wash with warm water and mild soap Steroid shot given in  office Prednisone prescribed.  Take as directed and to completion Use OTC zyrtec, allegra, or claritin during the day.  Benadryl at night. You may also use OTC hydrocortisone cream and/or calamine lotion to help alleviate itching Follow up with PCP if symptoms persists  Return or go to the ED if you have any new or worsening symptoms such as fever, chills, nausea, vomiting, difficulty breathing, throat swelling, tongue swelling, numbness/ tingling in mouth, worsening symptoms despite treatment, etc...   Reviewed expectations re: course of current medical issues. Questions answered. Outlined signs and symptoms indicating need for more acute intervention. Patient verbalized understanding. After Visit Summary given.   Lestine Box, PA-C 04/17/20 1330

## 2020-04-17 NOTE — ED Triage Notes (Signed)
Continues to have an outbreak poison oak.   Is currently taking prednisone.

## 2020-05-07 ENCOUNTER — Ambulatory Visit (HOSPITAL_COMMUNITY)
Admission: RE | Admit: 2020-05-07 | Discharge: 2020-05-07 | Disposition: A | Discharge status: Home / Self Care | Payer: 59 | Source: Ambulatory Visit | Attending: Internal Medicine | Admitting: Internal Medicine

## 2020-05-07 ENCOUNTER — Other Ambulatory Visit: Payer: Self-pay

## 2020-05-07 DIAGNOSIS — Z1231 Encounter for screening mammogram for malignant neoplasm of breast: Secondary | ICD-10-CM | POA: Diagnosis not present

## 2020-05-19 ENCOUNTER — Encounter: Payer: Self-pay | Admitting: Internal Medicine

## 2020-05-27 ENCOUNTER — Ambulatory Visit (INDEPENDENT_AMBULATORY_CARE_PROVIDER_SITE_OTHER): Payer: Self-pay | Admitting: *Deleted

## 2020-05-27 ENCOUNTER — Other Ambulatory Visit: Payer: Self-pay

## 2020-05-27 VITALS — Ht 69.0 in | Wt 213.0 lb

## 2020-05-27 DIAGNOSIS — Z1211 Encounter for screening for malignant neoplasm of colon: Secondary | ICD-10-CM

## 2020-05-27 NOTE — Progress Notes (Addendum)
Gastroenterology Pre-Procedure Review  Request Date: 05/27/2020 Requesting Physician: Dr. Sherrie Sport, Last TCS done 03/24/2009 by Dr. Rowe Pavy, normal colon, family hx of colon cancer (grandmother)  PATIENT REVIEW QUESTIONS: The patient responded to the following health history questions as indicated:    1. Diabetes Melitis: no 2. Joint replacements in the past 12 months: no 3. Major health problems in the past 3 months: no 4. Has an artificial valve or MVP: no 5. Has a defibrillator: no 6. Has been advised in past to take antibiotics in advance of a procedure like teeth cleaning: no 7. Family history of colon cancer: yes, grandmother: age late 54's  8. Alcohol Use: yes, 12 pack a week 9. Illicit drug Use: no 10. History of sleep apnea: no  11. History of coronary artery or other vascular stents placed within the last 12 months: no 12. History of any prior anesthesia complications: no 13. Body mass index is 31.45 kg/m.    MEDICATIONS & ALLERGIES:    Patient reports the following regarding taking any blood thinners:   Plavix? no Aspirin? no Coumadin? no Brilinta? no Xarelto? no Eliquis? no Pradaxa? no Savaysa? no Effient? no  Patient confirms/reports the following medications:  Current Outpatient Medications  Medication Sig Dispense Refill  . calcium-vitamin D (OSCAL WITH D) 500-200 MG-UNIT tablet Take 1 tablet by mouth daily. Takes 2 tablets daily.    . celecoxib (CELEBREX) 200 MG capsule Take 200 mg by mouth daily.    . Cholecalciferol (VITAMIN D3) 25 MCG (1000 UT) CAPS Take by mouth daily.    . diphenhydrAMINE (BENADRYL ALLERGY) 25 mg capsule Take 25 mg by mouth daily.    . hydrochlorothiazide (HYDRODIURIL) 12.5 MG tablet Take 12.5 mg by mouth daily.    Marland Kitchen losartan (COZAAR) 50 MG tablet Take 50 mg by mouth daily.    . Omega-3 Fatty Acids (FISH OIL) 1000 MG CAPS Take by mouth. Takes 3 capsules daily.    Marland Kitchen omeprazole (PRILOSEC) 20 MG capsule Take 20 mg by mouth daily.     No  current facility-administered medications for this visit.    Patient confirms/reports the following allergies:  Allergies  Allergen Reactions  . Nalbuphine Anaphylaxis and Other (See Comments)    No orders of the defined types were placed in this encounter.   AUTHORIZATION INFORMATION Primary Insurance: [employer],  ID #: [id number],  Group #: [id number] Pre-Cert / Auth required: No, not required  SCHEDULE INFORMATION: Procedure has been scheduled as follows:  Date: 06/21/2020 , Time: 1:15 PM Location: [facility] with Dr. Abbey Chatters  This Gastroenterology Pre-Precedure Review Form is being routed to the following provider(s): Walden Field, NP

## 2020-05-27 NOTE — Progress Notes (Signed)
Pt had multiple neck surgeries in 2012 and 2013.  Has rod in neck.  Dr. Vertell Limber did procedure.

## 2020-05-27 NOTE — Progress Notes (Addendum)
She prefers conscious sedation but not exactly opposed to propofol.  I was hesitate to schedule due to ETOH use.  I informed her that you would look it over and determine what is best for her or if ov was needed.  She voiced understanding.

## 2020-05-27 NOTE — Progress Notes (Signed)
Ok to schedule with Dr. Abbey Chatters on propofol/MAC.  ASA I/II

## 2020-05-27 NOTE — Progress Notes (Signed)
Do we know who she will be scheduled with?

## 2020-05-28 MED ORDER — NA SULFATE-K SULFATE-MG SULF 17.5-3.13-1.6 GM/177ML PO SOLN: 1.0000 | Freq: Once | ORAL | 0 refills | Status: AC

## 2020-05-28 NOTE — Addendum Note (Signed)
Addended by: Metro Kung on: 05/28/2020 10:29 AM   Modules accepted: Orders

## 2020-05-28 NOTE — Patient Instructions (Addendum)
Sheryl Reese  1960-06-07 MRN: [id number]    Covid test: 06/18/2020 at 2:30 PM.    Procedure Date: 06/21/2020 Time to register: 11:45 AM Place to register: [facility] Scheduled provider: Dr. Abbey Chatters    PREPARATION FOR COLONOSCOPY WITH SUPREP BOWEL PREP KIT  Note: Suprep Bowel Prep Kit is a split-dose (2day) regimen. Consumption of BOTH 6-ounce bottles is required for a complete prep.  Please notify us immediately if you are diabetic, take iron supplements, or if you are on Coumadin or any other blood thinners.  Please hold the following medications: n/a                                                                                                                                                  2 DAYS BEFORE PROCEDURE:  DATE: 06/19/2020   DAY: Saturday Begin clear liquid diet AFTER your lunch meal. NO SOLID FOODS after this point.  1 DAY BEFORE PROCEDURE:  DATE: 06/20/2020   DAY: Sunday Continue clear liquids the entire day - NO SOLID FOOD.   Diabetic medications adjustments for today: n/a  At 6:00pm: Complete steps 1 through 4 below, using ONE (1) 6-ounce bottle, before going to bed. Step 1:  Pour ONE (1) 6-ounce bottle of SUPREP liquid into the mixing container.  Step 2:  Add cool drinking water to the 16 ounce line on the container and mix.  Note: Dilute the solution concentrate as directed prior to use. Step 3:  DRINK ALL the liquid in the container. Step 4:  You MUST drink an additional two (2) or more 16 ounce containers of water over the next one (1) hour.   Continue clear liquids.  DAY OF PROCEDURE:   DATE: 06/21/2020   DAY: Monday If you take medications for your heart, blood pressure, or breathing, you may take these medications.  Diabetic medications adjustments for today: n/a  5 hours before your procedure at 8:15 AM: Step 1:  Pour ONE (1) 6-ounce bottle of SUPREP liquid into the mixing container.  Step 2:  Add cool drinking water to the 16 ounce line on  the container and mix.  Note: Dilute the solution concentrate as directed prior to use. Step 3:  DRINK ALL the liquid in the container. Step 4:  You MUST drink an additional two (2) or more 16 ounce containers of water over the next one (1) hour. You MUST complete the final glass of water at least 3 hours before your colonoscopy. Nothing by mouth past 10:15 AM.  You may take your morning medications with sip of water unless we have instructed otherwise.    Please see below for Dietary Information.  CLEAR LIQUIDS INCLUDE:  Water Jello (NOT red in color)   Ice Popsicles (NOT red in color)   Tea (sugar ok, no milk/cream) Powdered fruit flavored drinks  Coffee (sugar ok, no milk/cream) Gatorade/ Lemonade/ Kool-Aid  (NOT red in color)   Juice: apple, white grape, white cranberry Soft drinks  Clear bullion, consomme, broth (fat free beef/chicken/vegetable)  Carbonated beverages (any kind)  Strained chicken noodle soup Hard Candy   Remember: Clear liquids are liquids that will allow you to see your fingers on the other side of a clear glass. Be sure liquids are NOT red in color, and not cloudy, but CLEAR.  DO NOT EAT OR DRINK ANY OF THE FOLLOWING:  Dairy products of any kind   Cranberry juice Tomato juice / V8 juice   Grapefruit juice Orange juice     Red grape juice  Do not eat any solid foods, including such foods as: cereal, oatmeal, yogurt, fruits, vegetables, creamed soups, eggs, bread, crackers, pureed foods in a blender, etc.   HELPFUL HINTS FOR DRINKING PREP SOLUTION:   Make sure prep is extremely cold. Mix and refrigerate the the morning of the prep. You may also put in the freezer.   You may try mixing some Crystal Light or Country Time Lemonade if you prefer. Mix in small amounts; add more if necessary.  Try drinking through a straw  Rinse mouth with water or a mouthwash between glasses, to remove after-taste.  Try sipping on a cold beverage /ice/ popsicles between glasses  of prep.  Place a piece of sugar-free hard candy in mouth between glasses.  If you become nauseated, try consuming smaller amounts, or stretch out the time between glasses. Stop for 30-60 minutes, then slowly start back drinking.     OTHER INSTRUCTIONS  You will need a responsible adult at least 62 years of age to accompany you and drive you home. This person must remain in the waiting room during your procedure. The hospital will cancel your procedure if you do not have a responsible adult with you.   1. Wear loose fitting clothing that is easily removed. 2. Leave jewelry and other valuables at home.  3. Remove all body piercing jewelry and leave at home. 4. Total time from sign-in until discharge is approximately 2-3 hours. 5. You should go home directly after your procedure and rest. You can resume normal activities the day after your procedure. 6. The day of your procedure you should not:  Drive  Make legal decisions  Operate machinery  Drink alcohol  Return to work   You may call the office (Dept: [phone]) before 5:00pm, or page the doctor on call [phone]) after 5:00pm, for further instructions, if necessary.   Insurance Information YOU WILL NEED TO CHECK WITH YOUR INSURANCE COMPANY FOR THE BENEFITS OF COVERAGE YOU HAVE FOR THIS PROCEDURE.  UNFORTUNATELY, NOT ALL INSURANCE COMPANIES HAVE BENEFITS TO COVER ALL OR PART OF THESE TYPES OF PROCEDURES.  IT IS YOUR RESPONSIBILITY TO CHECK YOUR BENEFITS, HOWEVER, WE WILL BE GLAD TO ASSIST YOU WITH ANY CODES YOUR INSURANCE COMPANY MAY NEED.    PLEASE NOTE THAT MOST INSURANCE COMPANIES WILL NOT COVER A SCREENING COLONOSCOPY FOR PEOPLE UNDER THE AGE OF 50  IF YOU HAVE BCBS INSURANCE, YOU MAY HAVE BENEFITS FOR A SCREENING COLONOSCOPY BUT IF POLYPS ARE FOUND THE DIAGNOSIS WILL CHANGE AND THEN YOU MAY HAVE A DEDUCTIBLE THAT WILL NEED TO BE MET. SO PLEASE MAKE SURE YOU CHECK YOUR BENEFITS FOR A SCREENING COLONOSCOPY AS WELL AS  A DIAGNOSTIC COLONOSCOPY.

## 2020-05-31 ENCOUNTER — Ambulatory Visit: Payer: 59

## 2020-06-18 ENCOUNTER — Other Ambulatory Visit: Payer: Self-pay

## 2020-06-18 ENCOUNTER — Other Ambulatory Visit (HOSPITAL_COMMUNITY)
Admission: RE | Admit: 2020-06-18 | Discharge: 2020-06-18 | Disposition: A | Discharge status: Home / Self Care | Payer: 59 | Source: Ambulatory Visit | Attending: Internal Medicine | Admitting: Internal Medicine

## 2020-06-18 DIAGNOSIS — Z01812 Encounter for preprocedural laboratory examination: Secondary | ICD-10-CM | POA: Diagnosis present

## 2020-06-18 DIAGNOSIS — Z20822 Contact with and (suspected) exposure to covid-19: Secondary | ICD-10-CM | POA: Insufficient documentation

## 2020-06-18 LAB — BASIC METABOLIC PANEL
Anion gap: 8 (ref 5–15)
BUN: 11 mg/dL (ref 8–23)
CO2: 25 mmol/L (ref 22–32)
Calcium: 9 mg/dL (ref 8.9–10.3)
Chloride: 103 mmol/L (ref 98–111)
Creatinine, Ser: 0.79 mg/dL (ref 0.44–1.00)
GFR, Estimated: >60 mL/min (ref >60)
Glucose, Bld: 101 mg/dL — ABNORMAL HIGH (ref 70–99)
Potassium: 3.9 mmol/L (ref 3.5–5.1)
Sodium: 136 mmol/L (ref 135–145)

## 2020-06-19 LAB — SARS CORONAVIRUS 2 (TAT 6-24 HRS): SARS Coronavirus 2: NEGATIVE

## 2020-06-21 ENCOUNTER — Encounter (HOSPITAL_COMMUNITY): Payer: Self-pay

## 2020-06-21 ENCOUNTER — Encounter (HOSPITAL_COMMUNITY)
Admission: RE | Disposition: A | Discharge status: Home / Self Care | Payer: Self-pay | Source: Home / Self Care | Attending: Internal Medicine

## 2020-06-21 ENCOUNTER — Ambulatory Visit (HOSPITAL_COMMUNITY): Payer: 59 | Admitting: Anesthesiology

## 2020-06-21 ENCOUNTER — Ambulatory Visit (HOSPITAL_COMMUNITY)
Admission: RE | Admit: 2020-06-21 | Discharge: 2020-06-21 | Disposition: A | Discharge status: Home / Self Care | Payer: 59 | Attending: Internal Medicine | Admitting: Internal Medicine

## 2020-06-21 ENCOUNTER — Other Ambulatory Visit: Payer: Self-pay

## 2020-06-21 DIAGNOSIS — K648 Other hemorrhoids: Secondary | ICD-10-CM | POA: Diagnosis not present

## 2020-06-21 DIAGNOSIS — Z1211 Encounter for screening for malignant neoplasm of colon: Secondary | ICD-10-CM | POA: Diagnosis not present

## 2020-06-21 DIAGNOSIS — Z888 Allergy status to other drugs, medicaments and biological substances status: Secondary | ICD-10-CM | POA: Insufficient documentation

## 2020-06-21 DIAGNOSIS — Z8249 Family history of ischemic heart disease and other diseases of the circulatory system: Secondary | ICD-10-CM | POA: Diagnosis not present

## 2020-06-21 DIAGNOSIS — K219 Gastro-esophageal reflux disease without esophagitis: Secondary | ICD-10-CM | POA: Diagnosis not present

## 2020-06-21 DIAGNOSIS — D122 Benign neoplasm of ascending colon: Secondary | ICD-10-CM | POA: Insufficient documentation

## 2020-06-21 DIAGNOSIS — D123 Benign neoplasm of transverse colon: Secondary | ICD-10-CM | POA: Diagnosis not present

## 2020-06-21 DIAGNOSIS — K5730 Diverticulosis of large intestine without perforation or abscess without bleeding: Secondary | ICD-10-CM | POA: Insufficient documentation

## 2020-06-21 DIAGNOSIS — Z800 Family history of malignant neoplasm of digestive organs: Secondary | ICD-10-CM | POA: Diagnosis not present

## 2020-06-21 DIAGNOSIS — Z791 Long term (current) use of non-steroidal anti-inflammatories (NSAID): Secondary | ICD-10-CM | POA: Insufficient documentation

## 2020-06-21 DIAGNOSIS — K635 Polyp of colon: Secondary | ICD-10-CM | POA: Diagnosis not present

## 2020-06-21 DIAGNOSIS — I10 Essential (primary) hypertension: Secondary | ICD-10-CM | POA: Insufficient documentation

## 2020-06-21 DIAGNOSIS — Z79899 Other long term (current) drug therapy: Secondary | ICD-10-CM | POA: Insufficient documentation

## 2020-06-21 HISTORY — PX: BIOPSY: SHX5522

## 2020-06-21 HISTORY — PX: COLONOSCOPY WITH PROPOFOL: SHX5780

## 2020-06-21 HISTORY — DX: Gastro-esophageal reflux disease without esophagitis: K21.9

## 2020-06-21 SURGERY — COLONOSCOPY WITH PROPOFOL
Anesthesia: General

## 2020-06-21 MED ORDER — LIDOCAINE HCL (PF) 2 % IJ SOLN
INTRAMUSCULAR | Status: AC
  Filled 2020-06-21: qty 10

## 2020-06-21 MED ORDER — PROPOFOL 10 MG/ML IV BOLUS
INTRAVENOUS | Status: AC
  Filled 2020-06-21: qty 80

## 2020-06-21 MED ORDER — PROPOFOL 500 MG/50ML IV EMUL
INTRAVENOUS | Status: DC | PRN
  Administered 2020-06-21: 150 ug/kg/min via INTRAVENOUS

## 2020-06-21 MED ORDER — PROPOFOL 10 MG/ML IV BOLUS
INTRAVENOUS | Status: AC
  Filled 2020-06-21: qty 60

## 2020-06-21 MED ORDER — LACTATED RINGERS IV SOLN: INTRAVENOUS | Status: DC

## 2020-06-21 MED ORDER — PROPOFOL 10 MG/ML IV BOLUS
INTRAVENOUS | Status: DC | PRN
  Administered 2020-06-21: 30 mg via INTRAVENOUS
  Administered 2020-06-21: 100 mg via INTRAVENOUS

## 2020-06-21 NOTE — Discharge Instructions (Addendum)
Colonoscopy Discharge Instructions  Read the instructions outlined below and refer to this sheet in the next few weeks. These discharge instructions provide you with general information on caring for yourself after you leave the hospital. Your doctor may also give you specific instructions. While your treatment has been planned according to the most current medical practices available, unavoidable complications occasionally occur.   ACTIVITY  You may resume your regular activity, but move at a slower pace for the next 24 hours.   Take frequent rest periods for the next 24 hours.   Walking will help get rid of the air and reduce the bloated feeling in your belly (abdomen).   No driving for 24 hours (because of the medicine (anesthesia) used during the test).    Do not sign any important legal documents or operate any machinery for 24 hours (because of the anesthesia used during the test).  NUTRITION  Drink plenty of fluids.   You may resume your normal diet as instructed by your doctor.   Begin with a light meal and progress to your normal diet. Heavy or fried foods are harder to digest and may make you feel sick to your stomach (nauseated).   Avoid alcoholic beverages for 24 hours or as instructed.  MEDICATIONS  You may resume your normal medications unless your doctor tells you otherwise.  WHAT YOU CAN EXPECT TODAY  Some feelings of bloating in the abdomen.   Passage of more gas than usual.   Spotting of blood in your stool or on the toilet paper.  IF YOU HAD POLYPS REMOVED DURING THE COLONOSCOPY:  No aspirin products for 7 days or as instructed.   No alcohol for 7 days or as instructed.   Eat a soft diet for the next 24 hours.  FINDING OUT THE RESULTS OF YOUR TEST Not all test results are available during your visit. If your test results are not back during the visit, make an appointment with your caregiver to find out the results. Do not assume everything is normal if  you have not heard from your caregiver or the medical facility. It is important for you to follow up on all of your test results.  SEEK IMMEDIATE MEDICAL ATTENTION IF:  You have more than a spotting of blood in your stool.   Your belly is swollen (abdominal distention).   You are nauseated or vomiting.   You have a temperature over 101.   You have abdominal pain or discomfort that is severe or gets worse throughout the day.   Your colonoscopy revealed 3 polyp(s) which I removed successfully. Await pathology results, my office will contact you. I recommend repeating colonoscopy in 5 years for surveillance purposes. You also have diverticulosis and internal hemorrhoids. I would recommend increasing fiber in your diet or adding OTC Benefiber/Metamucil. Be sure to drink at least 4 to 6 glasses of water daily. Follow-up with GI as needed.    I hope you have a great rest of your week!  Elon Alas. Abbey Chatters, D.O. Gastroenterology and Hepatology [facility]   Colon Polyps  Colon polyps are tissue growths inside the colon, which is part of the large intestine. They are one of the types of polyps that can grow in the body. A polyp may be a round bump or a mushroom-shaped growth. You could have one polyp or more than one. Most colon polyps are noncancerous (benign). However, some colon polyps can become cancerous over time. Finding and removing the polyps early  can help prevent this. What are the causes? The exact cause of colon polyps is not known. What increases the risk? The following factors may make you more likely to develop this condition:  Having a family history of colorectal cancer or colon polyps.  Being older than 62 years of age.  Being younger than 62 years of age and having a significant family history of colorectal cancer or colon polyps or a genetic condition that puts you at higher risk of getting colon polyps.  Having inflammatory bowel disease,  such as ulcerative colitis or Crohn's disease.  Having certain conditions passed from parent to child (hereditary conditions), such as: ? Familial adenomatous polyposis (FAP). ? Lynch syndrome. ? Turcot syndrome. ? Peutz-Jeghers syndrome. ? MUTYH-associated polyposis (MAP).  Being overweight.  Certain lifestyle factors. These include smoking cigarettes, drinking too much alcohol, not getting enough exercise, and eating a diet that is high in fat and red meat and low in fiber.  Having had childhood cancer that was treated with radiation of the abdomen. What are the signs or symptoms? Many times, there are no symptoms. If you have symptoms, they may include:  Blood coming from the rectum during a bowel movement.  Blood in the stool (feces). The blood may be bright red or very dark in color.  Pain in the abdomen.  A change in bowel habits, such as constipation or diarrhea. How is this diagnosed? This condition is diagnosed with a colonoscopy. This is a procedure in which a lighted, flexible scope is inserted into the opening between the buttocks (anus) and then passed into the colon to examine the area. Polyps are sometimes found when a colonoscopy is done as part of routine cancer screening tests. How is this treated? This condition is treated by removing any polyps that are found. Most polyps can be removed during a colonoscopy. Those polyps will then be tested for cancer. Additional treatment may be needed depending on the results of testing. Follow these instructions at home: Eating and drinking  Eat foods that are high in fiber, such as fruits, vegetables, and whole grains.  Eat foods that are high in calcium and vitamin D, such as milk, cheese, yogurt, eggs, liver, fish, and broccoli.  Limit foods that are high in fat, such as fried foods and desserts.  Limit the amount of red meat, precooked or cured meat, or other processed meat that you eat, such as hot dogs, sausages,  bacon, or meat loaves.  Limit sugary drinks.   Lifestyle  Maintain a healthy weight, or lose weight if recommended by your health care provider.  Exercise every day or as told by your health care provider.  Do not use any products that contain nicotine or tobacco, such as cigarettes, e-cigarettes, and chewing tobacco. If you need help quitting, ask your health care provider.  Do not drink alcohol if: ? Your health care provider tells you not to drink. ? You are pregnant, may be pregnant, or are planning to become pregnant.  If you drink alcohol: ? Limit how much you use to:  0-1 drink a day for women.  0-2 drinks a day for men. ? Know how much alcohol is in your drink. In the [place] one drink equals one 12 oz bottle of beer (355 mL), one 5 oz glass of wine (148 mL), or one 1 oz glass of hard liquor (44 mL). General instructions  Take over-the-counter and prescription medicines only as told by your health care provider.  Keep  all follow-up visits. This is important. This includes having regularly scheduled colonoscopies. Talk to your health care provider about when you need a colonoscopy. Contact a health care provider if:  You have new or worsening bleeding during a bowel movement.  You have new or increased blood in your stool.  You have a change in bowel habits.  You lose weight for no known reason. Summary  Colon polyps are tissue growths inside the colon, which is part of the large intestine. They are one type of polyp that can grow in the body.  Most colon polyps are noncancerous (benign), but some can become cancerous over time.  This condition is diagnosed with a colonoscopy.  This condition is treated by removing any polyps that are found. Most polyps can be removed during a colonoscopy. This information is not intended to replace advice given to you by your health care provider. Make sure you discuss any questions you have with your health care  provider. Document Revised: 05/07/2019 Document Reviewed: 05/07/2019 Elsevier Patient Education  2021 [place].  Diverticulosis  Diverticulosis is a condition that develops when small pouches (diverticula) form in the wall of the large intestine (colon). The colon is where water is absorbed and stool (feces) is formed. The pouches form when the inside layer of the colon pushes through weak spots in the outer layers of the colon. You may have a few pouches or many of them. The pouches usually do not cause problems unless they become inflamed or infected. When this happens, the condition is called diverticulitis. What are the causes? The cause of this condition is not known. What increases the risk? The following factors may make you more likely to develop this condition:  Being older than age 39. Your risk for this condition increases with age. Diverticulosis is rare among people younger than age 19. By age 64, many people have it.  Eating a low-fiber diet.  Having frequent constipation.  Being overweight.  Not getting enough exercise.  Smoking.  Taking over-the-counter pain medicines, like aspirin and ibuprofen.  Having a family history of diverticulosis. What are the signs or symptoms? In most people, there are no symptoms of this condition. If you do have symptoms, they may include:  Bloating.  Cramps in the abdomen.  Constipation or diarrhea.  Pain in the lower left side of the abdomen. How is this diagnosed? Because diverticulosis usually has no symptoms, it is most often diagnosed during an exam for other colon problems. The condition may be diagnosed by:  Using a flexible scope to examine the colon (colonoscopy).  Taking an X-ray of the colon after dye has been put into the colon (barium enema).  Having a CT scan. How is this treated? You may not need treatment for this condition. Your health care provider may recommend treatment to prevent problems. You may  need treatment if you have symptoms or if you previously had diverticulitis. Treatment may include:  Eating a high-fiber diet.  Taking a fiber supplement.  Taking a live bacteria supplement (probiotic).  Taking medicine to relax your colon.   Follow these instructions at home: Medicines  Take over-the-counter and prescription medicines only as told by your health care provider.  If told by your health care provider, take a fiber supplement or probiotic. Constipation prevention Your condition may cause constipation. To prevent or treat constipation, you may need to:  Drink enough fluid to keep your urine pale yellow.  Take over-the-counter or prescription medicines.  Eat foods  that are high in fiber, such as beans, whole grains, and fresh fruits and vegetables.  Limit foods that are high in fat and processed sugars, such as fried or sweet foods.   General instructions  Try not to strain when you have a bowel movement.  Keep all follow-up visits as told by your health care provider. This is important. Contact a health care provider if you:  Have pain in your abdomen.  Have bloating.  Have cramps.  Have not had a bowel movement in 3 days. Get help right away if:  Your pain gets worse.  Your bloating becomes very bad.  You have a fever or chills, and your symptoms suddenly get worse.  You vomit.  You have bowel movements that are bloody or black.  You have bleeding from your rectum. Summary  Diverticulosis is a condition that develops when small pouches (diverticula) form in the wall of the large intestine (colon).  You may have a few pouches or many of them.  This condition is most often diagnosed during an exam for other colon problems.  Treatment may include increasing the fiber in your diet, taking supplements, or taking medicines. This information is not intended to replace advice given to you by your health care provider. Make sure you discuss any  questions you have with your health care provider. Document Revised: 08/15/2018 Document Reviewed: 08/15/2018 Elsevier Patient Education  [place].

## 2020-06-21 NOTE — Op Note (Signed)
[facility] Patient Name: Sheryl Reese Procedure Date: 06/21/2020 1:08 PM MRN: [id number] Date of Birth: 03-04-1960 Attending MD: Elon Alas. Abbey Chatters DO CSN: [id number] Age: 60 Admit Type: Outpatient Procedure:                Colonoscopy Indications:              Screening for colorectal malignant neoplasm Providers:                Elon Alas. Abbey Chatters, DO, Janeece Riggers, RN, Raphael Gibney, Technician Referring MD:              Medicines:                See the Anesthesia note for documentation of the                            administered medications Complications:            No immediate complications. Estimated Blood Loss:     Estimated blood loss was minimal. Procedure:                Pre-Anesthesia Assessment:                           - The anesthesia plan was to use monitored                            anesthesia care (MAC).                           After obtaining informed consent, the colonoscope                            was passed under direct vision. Throughout the                            procedure, the patient's blood pressure, pulse, and                            oxygen saturations were monitored continuously. The                            [id number]-H190DL ([id number]) scope was introduced through                            the anus and advanced to the the cecum, identified                            by appendiceal orifice and ileocecal valve. The                            colonoscopy was performed without difficulty. The                            patient tolerated the procedure well. The  quality                            of the bowel preparation was evaluated using the                            BBPS [facility]  Bowel Preparation Scale) with scores                            of: Right Colon = 3, Transverse Colon = 3 and Left                            Colon = 3 (entire mucosa seen well with no residual                            staining, small  fragments of stool or opaque                            liquid). The total BBPS score equals 9. Scope In: 1:27:02 PM Scope Out: 1:43:08 PM Scope Withdrawal Time: 0 hours 12 minutes 1 second  Total Procedure Duration: 0 hours 16 minutes 6 seconds  Findings:      The perianal and digital rectal examinations were normal.      Non-bleeding internal hemorrhoids were found during endoscopy.      Multiple small and large-mouthed diverticula were found in the sigmoid       colon and descending colon.      Three sessile polyps were found in the transverse colon and ascending       colon. The polyps were 2 mm in size. These polyps were removed with a       cold biopsy forceps. Resection and retrieval were complete.      The exam was otherwise without abnormality. Impression:               - Non-bleeding internal hemorrhoids.                           - Diverticulosis in the sigmoid colon and in the                            descending colon.                           - Three 2 mm polyps in the transverse colon and in                            the ascending colon, removed with a cold biopsy                            forceps. Resected and retrieved.                           - The examination was otherwise normal. Moderate Sedation:      Per Anesthesia Care Recommendation:           - Patient has a contact number available  for                            emergencies. The signs and symptoms of potential                            delayed complications were discussed with the                            patient. Return to normal activities tomorrow.                            Written discharge instructions were provided to the                            patient.                           - Resume previous diet.                           - Continue present medications.                           - Await pathology results.                           - Repeat colonoscopy in 5 years for surveillance.                            - Return to GI clinic PRN. Procedure Code(s):        --- Professional ---                           [phone], Colonoscopy, flexible; with biopsy, single                            or multiple Diagnosis Code(s):        --- Professional ---                           Z12.11, Encounter for screening for malignant                            neoplasm of colon                           K64.8, Other hemorrhoids                           K63.5, Polyp of colon                           K57.30, Diverticulosis of large intestine without                            perforation or abscess without bleeding CPT copyright 2019 American Medical Association. All rights reserved. The  codes documented in this report are preliminary and upon coder review may  be revised to meet current compliance requirements. Elon Alas. Abbey Chatters, DO [place] Abbey Chatters, DO 06/21/2020 1:47:47 PM This report has been signed electronically. Number of Addenda: 0

## 2020-06-21 NOTE — Anesthesia Preprocedure Evaluation (Signed)
Anesthesia Evaluation  Patient identified by MRN, date of birth, ID band Patient awake    Reviewed: Allergy & Precautions, NPO status , Patient's Chart, lab work & pertinent test results  History of Anesthesia Complications Negative for: history of anesthetic complications  Airway Mallampati: III  TM Distance: >3 FB Neck ROM: Full    Dental  (+) Dental Advisory Given Crown, fillings :   Pulmonary neg pulmonary ROS,    Pulmonary exam normal breath sounds clear to auscultation       Cardiovascular Exercise Tolerance: Good hypertension, Pt. on medications Normal cardiovascular exam Rhythm:Regular Rate:Normal     Neuro/Psych negative neurological ROS  negative psych ROS   GI/Hepatic Neg liver ROS, GERD  Medicated and Controlled,  Endo/Other  negative endocrine ROS  Renal/GU negative Renal ROS     Musculoskeletal negative musculoskeletal ROS (+)   Abdominal   Peds  Hematology negative hematology ROS (+)   Anesthesia Other Findings   Reproductive/Obstetrics negative OB ROS                             Anesthesia Physical Anesthesia Plan  ASA: II  Anesthesia Plan: General   Post-op Pain Management:    Induction: Intravenous  PONV Risk Score and Plan: Propofol infusion  Airway Management Planned: Nasal Cannula and Natural Airway  Additional Equipment:   Intra-op Plan:   Post-operative Plan:   Informed Consent: I have reviewed the patients History and Physical, chart, labs and discussed the procedure including the risks, benefits and alternatives for the proposed anesthesia with the patient or authorized representative who has indicated his/her understanding and acceptance.     Dental advisory given  Plan Discussed with: CRNA and Surgeon  Anesthesia Plan Comments:         Anesthesia Quick Evaluation

## 2020-06-21 NOTE — Anesthesia Postprocedure Evaluation (Signed)
Anesthesia Post Note  Patient: DEVONE BONILLA  Procedure(s) Performed: COLONOSCOPY WITH PROPOFOL (N/A ) BIOPSY  Patient location during evaluation: Endoscopy Anesthesia Type: General Level of consciousness: awake and alert and oriented Pain management: pain level controlled Vital Signs Assessment: post-procedure vital signs reviewed and stable Respiratory status: spontaneous breathing and respiratory function stable Cardiovascular status: blood pressure returned to baseline and stable Postop Assessment: no apparent nausea or vomiting Anesthetic complications: no   No complications documented.   Last Vitals:  Vitals:   06/21/20 1148 06/21/20 1345  BP: (!) 148/78 122/66  Pulse: 82 76  Resp: 14 20  Temp: 36.7 C 36.4 C  SpO2: 96% 98%    Last Pain:  Vitals:   06/21/20 1345  TempSrc: Oral  PainSc: 0-No pain                 Tanairi Cypert C Amelita Risinger

## 2020-06-21 NOTE — H&P (Signed)
Primary Care Physician:  Neale Burly, MD Primary Gastroenterologist:  Dr. Abbey Chatters  Pre-Procedure History & Physical: HPI:  Sheryl Reese is a 60 y.o. female is here for a colonoscopy for colon cancer screening purposes. Last TCS done 03/24/2009 by Dr. Rowe Pavy, normal colon, family hx of colon cancer (grandmother)  No melena or hematochezia.  No abdominal pain or unintentional weight loss.  No change in bowel habits.  Overall feels well from a GI standpoint.  Past Medical History:  Diagnosis Date  . GERD (gastroesophageal reflux disease)   . Hypertension     Past Surgical History:  Procedure Laterality Date  . ABDOMINAL HYSTERECTOMY  2005   complete hysterectomy   . BUNIONECTOMY  2006   BUNION REMOVED   . LUNG SURGERY  1991   cyst on lung removed   . LUNG SURGERY  1991   CYST REMOVED   . NECK SURGERY  2012   ROD PLACED IN NECK     Prior to Admission medications   Medication Sig Start Date End Date Taking? Authorizing Provider  Calcium Carbonate-Vitamin D 600-200 MG-UNIT CAPS Take 1 tablet by mouth 2 (two) times daily.   Yes [provider]  celecoxib (CELEBREX) 200 MG capsule Take 200 mg by mouth daily.   Yes [provider]  Cholecalciferol (VITAMIN D3) 25 MCG (1000 UT) CAPS Take 1,000 Units by mouth daily.   Yes [provider]  diphenhydrAMINE (BENADRYL) 25 mg capsule Take 25 mg by mouth daily.   Yes [provider]  hydrochlorothiazide (HYDRODIURIL) 12.5 MG tablet Take 12.5 mg by mouth daily.   Yes [provider]  losartan (COZAAR) 50 MG tablet Take 50 mg by mouth daily.   Yes [provider]  Omega-3 Fatty Acids (FISH OIL) 1000 MG CAPS Take 1,000 mg by mouth 3 (three) times daily.   Yes [provider]  omeprazole (PRILOSEC) 20 MG capsule Take 20 mg by mouth daily.   Yes [provider]    Allergies as of 05/28/2020 - Review Complete 05/27/2020  Allergen Reaction Noted  . Nalbuphine Anaphylaxis and  Other (See Comments) 05/26/2011    Family History  Problem Relation Age of Onset  . Colon cancer Maternal Grandmother   . Hypertension Maternal Grandfather   . Hypertension Mother   . Hypertension Sister     Social History   Socioeconomic History  . Marital status: Single    Spouse name: Not on file  . Number of children: Not on file  . Years of education: Not on file  . Highest education level: Not on file  Occupational History  . Not on file  Tobacco Use  . Smoking status: Never Smoker  . Smokeless tobacco: Never Used  Vaping Use  . Vaping Use: Never used  Substance and Sexual Activity  . Alcohol use: Yes    Alcohol/week: 3.0 standard drinks    Types: 3 Cans of beer per week  . Drug use: Never  . Sexual activity: Not on file  Other Topics Concern  . Not on file  Social History Narrative  . Not on file   Social Determinants of Health   Financial Resource Strain: Not on file  Food Insecurity: Not on file  Transportation Needs: Not on file  Physical Activity: Not on file  Stress: Not on file  Social Connections: Not on file  Intimate Partner Violence: Not on file    Review of Systems: See HPI, otherwise negative ROS  Physical Exam: Vital  signs in last 24 hours: Temp:  [98 F (36.7 C)] 98 F (36.7 C) (05/23 1148) Pulse Rate:  [82] 82 (05/23 1148) Resp:  [14] 14 (05/23 1148) BP: (148)/(78) 148/78 (05/23 1148) SpO2:  [96 %] 96 % (05/23 1148) Weight:  [95.3 kg] 95.3 kg (05/23 1148)   General:   Alert,  Well-developed, well-nourished, pleasant and cooperative in NAD Head:  Normocephalic and atraumatic. Eyes:  Sclera clear, no icterus.   Conjunctiva pink. Ears:  Normal auditory acuity. Nose:  No deformity, discharge,  or lesions. Mouth:  No deformity or lesions, dentition normal. Neck:  Supple; no masses or thyromegaly. Lungs:  Clear throughout to auscultation.   No wheezes, crackles, or rhonchi. No acute distress. Heart:  Regular rate and rhythm; no  murmurs, clicks, rubs,  or gallops. Abdomen:  Soft, nontender and nondistended. No masses, hepatosplenomegaly or hernias noted. Normal bowel sounds, without guarding, and without rebound.   Msk:  Symmetrical without gross deformities. Normal posture. Extremities:  Without clubbing or edema. Neurologic:  Alert and  oriented x4;  grossly normal neurologically. Skin:  Intact without significant lesions or rashes. Cervical Nodes:  No significant cervical adenopathy. Psych:  Alert and cooperative. Normal mood and affect.  Impression/Plan: Sheryl Reese is here for a colonoscopy to be performed for colon cancer screening purposes.  The risks of the procedure including infection, bleed, or perforation as well as benefits, limitations, alternatives and imponderables have been reviewed with the patient. Questions have been answered. All parties agreeable.

## 2020-06-21 NOTE — Transfer of Care (Signed)
Immediate Anesthesia Transfer of Care Note  Patient: Sheryl Reese  Procedure(s) Performed: COLONOSCOPY WITH PROPOFOL (N/A ) BIOPSY  Patient Location: PACU  Anesthesia Type:General  Level of Consciousness: awake, alert  and oriented  Airway & Oxygen Therapy: Patient Spontanous Breathing  Post-op Assessment: Report given to RN and Post -op Vital signs reviewed and stable  Post vital signs: Reviewed and stable  Last Vitals:  Vitals Value Taken Time  BP    Temp    Pulse    Resp    SpO2      Last Pain:  Vitals:   06/21/20 1323  TempSrc:   PainSc: 0-No pain      Patients Stated Pain Goal: 8 (70/[id number])  Complications: No complications documented.

## 2020-06-22 LAB — SURGICAL PATHOLOGY

## 2020-06-29 ENCOUNTER — Encounter (HOSPITAL_COMMUNITY): Payer: Self-pay | Admitting: Internal Medicine

## 2020-06-29 ENCOUNTER — Telehealth: Payer: Self-pay | Admitting: *Deleted

## 2020-06-29 NOTE — Telephone Encounter (Signed)
Patient called in. Had procedure last week. Since then she has been having LLQ pain. If she is still not moving she is fine just worse with up moving around.

## 2020-06-29 NOTE — Telephone Encounter (Signed)
Hey Dr. Abbey Chatters  This pt had a procedure done on 05/23 and she is complaining of pain on LLQ. I see where the note has not resulted yet. Can you please take a look at her procedure note and advise.  thanks

## 2020-06-30 NOTE — Telephone Encounter (Signed)
noted 

## 2020-06-30 NOTE — Telephone Encounter (Signed)
I called and discussed with patient. She is feeling better. Will continue to monitor. Thanks

## 2021-03-30 ENCOUNTER — Other Ambulatory Visit (HOSPITAL_COMMUNITY): Payer: Self-pay | Admitting: Internal Medicine

## 2021-03-30 DIAGNOSIS — Z1231 Encounter for screening mammogram for malignant neoplasm of breast: Secondary | ICD-10-CM

## 2021-05-09 ENCOUNTER — Ambulatory Visit (HOSPITAL_COMMUNITY)
Admission: RE | Admit: 2021-05-09 | Discharge: 2021-05-09 | Disposition: A | Discharge status: Home / Self Care | Payer: 59 | Source: Ambulatory Visit | Attending: Internal Medicine | Admitting: Internal Medicine

## 2021-05-09 DIAGNOSIS — Z1231 Encounter for screening mammogram for malignant neoplasm of breast: Secondary | ICD-10-CM

## 2021-09-22 DIAGNOSIS — I10 Essential (primary) hypertension: Secondary | ICD-10-CM | POA: Diagnosis not present

## 2022-01-25 DIAGNOSIS — E119 Type 2 diabetes mellitus without complications: Secondary | ICD-10-CM | POA: Diagnosis not present

## 2022-01-25 DIAGNOSIS — I10 Essential (primary) hypertension: Secondary | ICD-10-CM | POA: Diagnosis not present

## 2022-01-25 DIAGNOSIS — K2100 Gastro-esophageal reflux disease with esophagitis, without bleeding: Secondary | ICD-10-CM | POA: Diagnosis not present

## 2022-01-25 DIAGNOSIS — L918 Other hypertrophic disorders of the skin: Secondary | ICD-10-CM | POA: Diagnosis not present

## 2022-01-25 DIAGNOSIS — E7849 Other hyperlipidemia: Secondary | ICD-10-CM | POA: Diagnosis not present

## 2022-02-27 DIAGNOSIS — M79674 Pain in right toe(s): Secondary | ICD-10-CM | POA: Diagnosis not present

## 2022-02-27 DIAGNOSIS — M2011 Hallux valgus (acquired), right foot: Secondary | ICD-10-CM | POA: Diagnosis not present

## 2022-02-27 DIAGNOSIS — M2041 Other hammer toe(s) (acquired), right foot: Secondary | ICD-10-CM | POA: Diagnosis not present

## 2022-03-31 ENCOUNTER — Other Ambulatory Visit (HOSPITAL_COMMUNITY): Payer: Self-pay | Admitting: Internal Medicine

## 2022-03-31 DIAGNOSIS — Z1231 Encounter for screening mammogram for malignant neoplasm of breast: Secondary | ICD-10-CM

## 2022-05-10 ENCOUNTER — Encounter (HOSPITAL_BASED_OUTPATIENT_CLINIC_OR_DEPARTMENT_OTHER): Payer: Self-pay | Admitting: Podiatry

## 2022-05-10 NOTE — Progress Notes (Signed)
Spoke w/ via phone for pre-op interview--- [employer]----  ISTAT and EKG             Lab results------ COVID test -----patient states asymptomatic no test needed Arrive at -------0645 NPO after MN NO Solid Food.   Med rec completed Medications to take morning of surgery -----Prilosec Diabetic medication ----- Patient instructed no nail polish to be worn day of surgery Patient instructed to bring photo id and insurance card day of surgery Patient aware to have Driver (ride ) / caregiver Marilu Favre   for 24 hours after surgery  Patient Special Instructions ----- Pre-Op special Istructions ----- Patient verbalized understanding of instructions that were given at this phone interview. Patient denies shortness of breath, chest pain, fever, cough at this phone interview.  Awaiting H&P patient has been to her primary care provider but we have not received H&P yet.

## 2022-05-15 ENCOUNTER — Ambulatory Visit (HOSPITAL_COMMUNITY)
Admission: RE | Admit: 2022-05-15 | Discharge: 2022-05-15 | Disposition: A | Discharge status: Home / Self Care | Payer: Medicaid Other | Source: Ambulatory Visit | Attending: Internal Medicine | Admitting: Internal Medicine

## 2022-05-15 DIAGNOSIS — Z1231 Encounter for screening mammogram for malignant neoplasm of breast: Secondary | ICD-10-CM | POA: Diagnosis present

## 2022-05-17 NOTE — Progress Notes (Signed)
Requested h and p for 05-19-2022 surgery with kristen at dr Theresia Bough office, [place]  stated pt is cancelling surgery

## 2022-05-19 ENCOUNTER — Ambulatory Visit (HOSPITAL_BASED_OUTPATIENT_CLINIC_OR_DEPARTMENT_OTHER): Admission: RE | Admit: 2022-05-19 | Payer: Medicaid Other | Source: Home / Self Care | Admitting: Podiatry

## 2022-05-19 DIAGNOSIS — I10 Essential (primary) hypertension: Secondary | ICD-10-CM

## 2022-05-19 SURGERY — BUNIONECTOMY
Anesthesia: Monitor Anesthesia Care | Site: Toe | Laterality: Right

## 2022-05-22 IMAGING — MG MM DIGITAL SCREENING BILAT W/ TOMO AND CAD
6 of 10 series · 6 of 30 positions shown · non-contrast
Comparison: Previous exam(s).

ACR Breast Density Category a: The breast tissue is almost entirely
fatty.

CLINICAL DATA: Screening.

EXAM:
DIGITAL SCREENING BILATERAL MAMMOGRAM WITH TOMOSYNTHESIS AND CAD
TECHNIQUE: Bilateral screening digital craniocaudal and mediolateral oblique
mammograms were obtained. Bilateral screening digital breast
tomosynthesis was performed. The images were evaluated with
computer-aided detection.

[L MLO synth-2D]
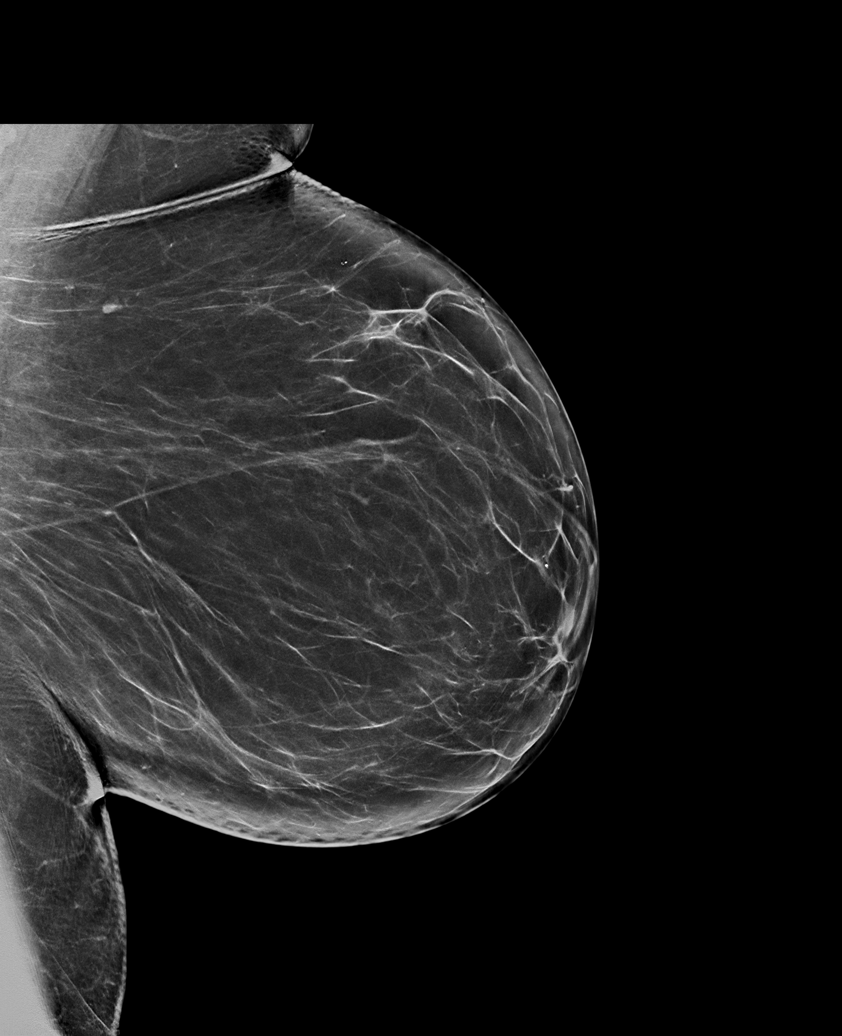

[R CC synth-2D]
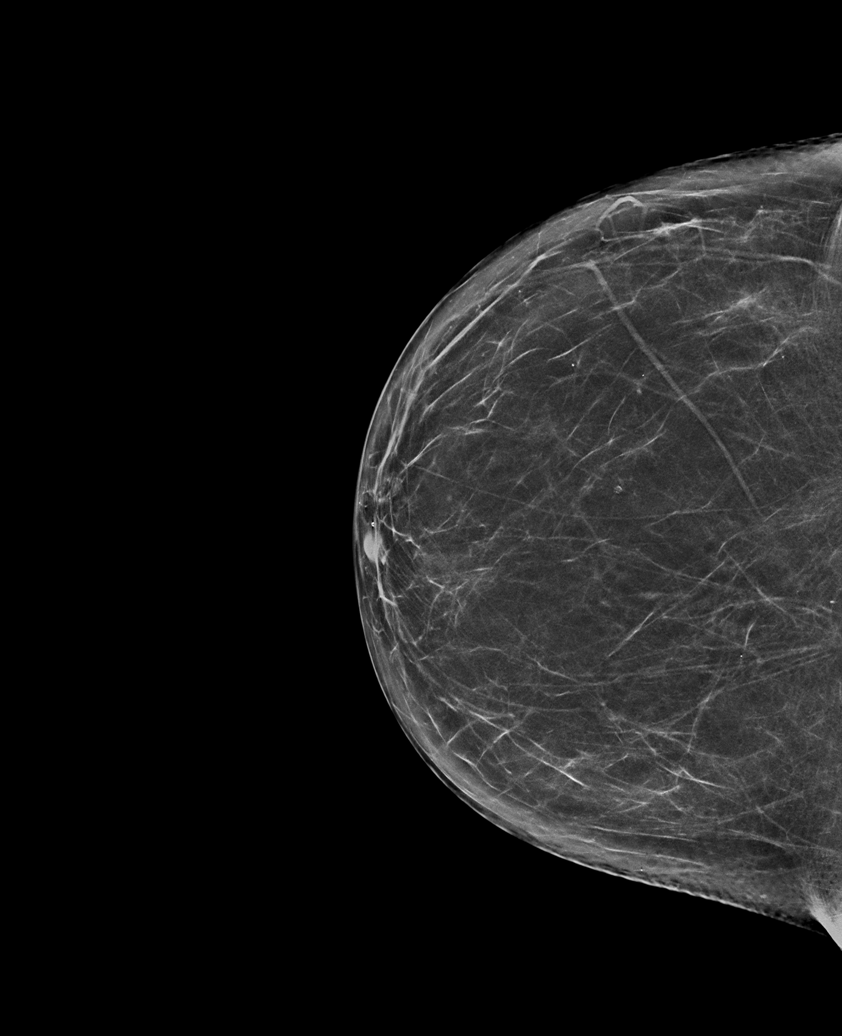

[L CC synth-2D]
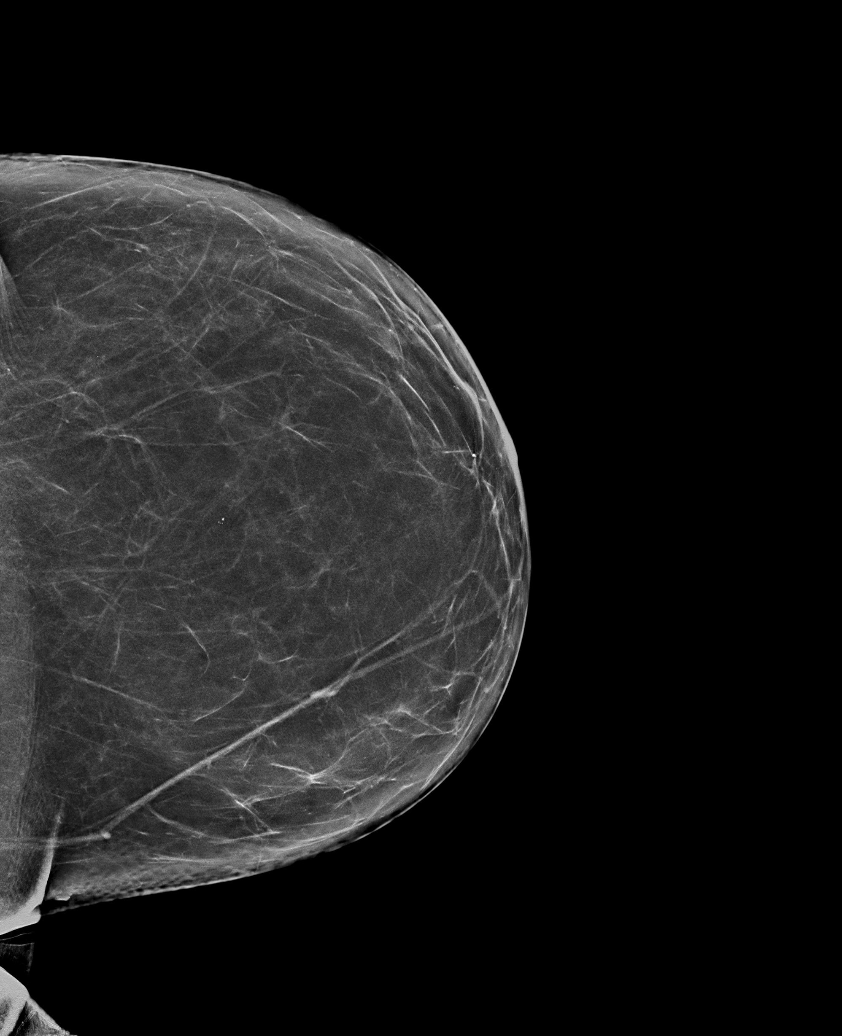

[R MLO synth-2D (1 of 2)]
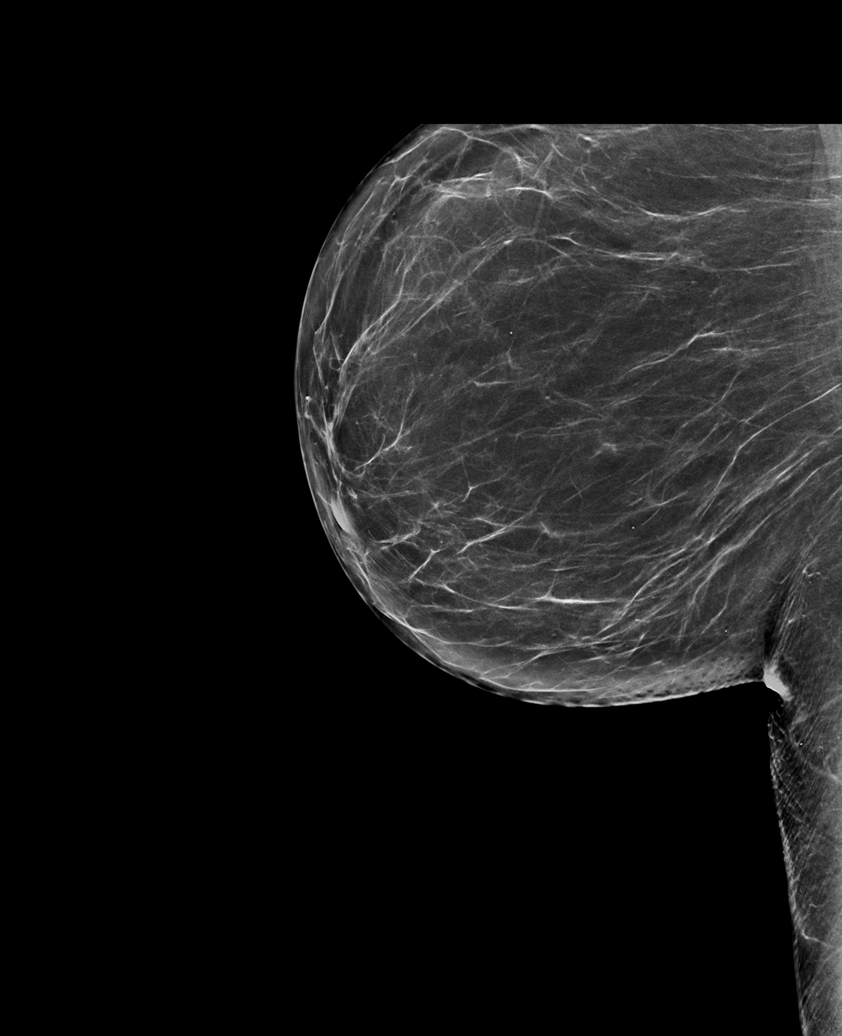

[R MLO synth-2D (2 of 2)]
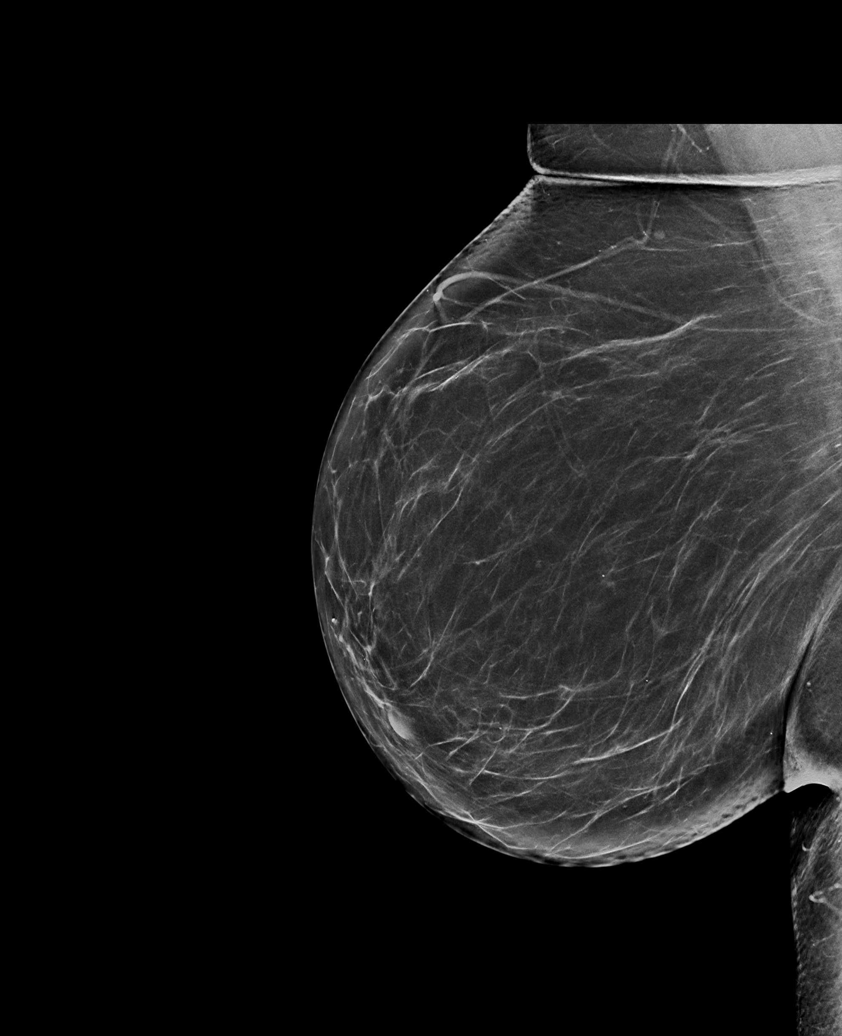

[L CC tomo · tomo slice 36/71.0]
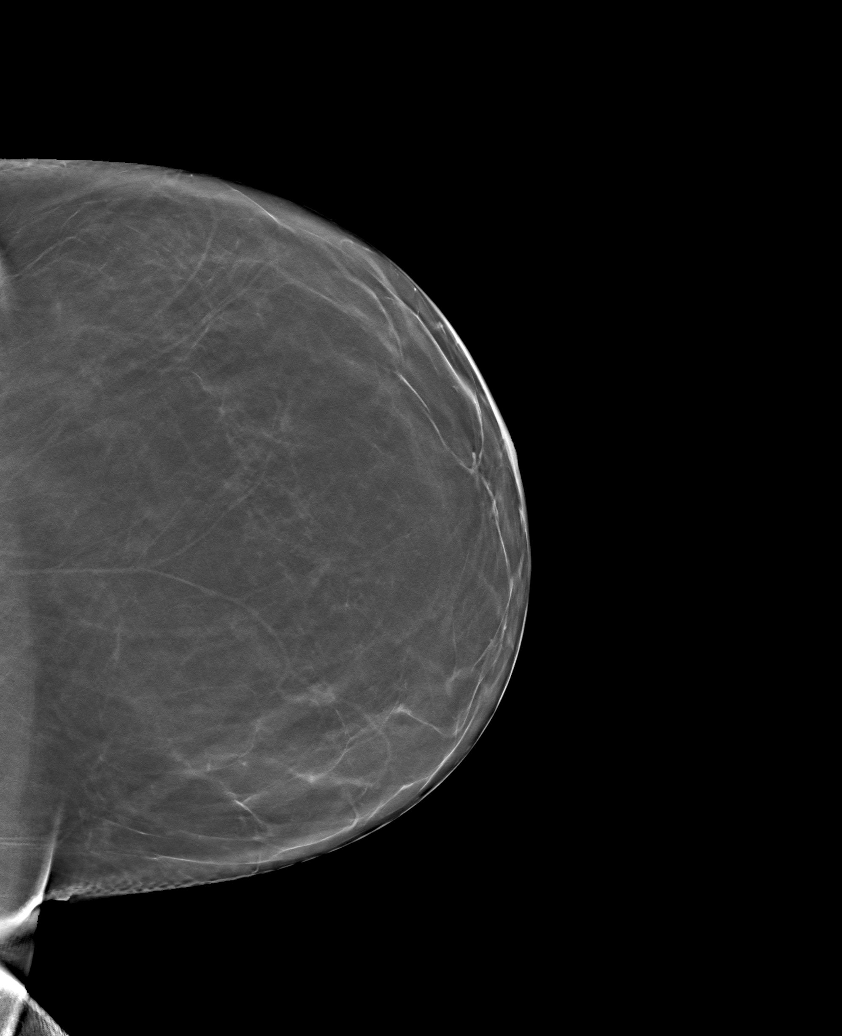

[6 of 30 positions shown; findings below may reference images not displayed]

FINDINGS: There are no findings suspicious for malignancy.
IMPRESSION: No mammographic evidence of malignancy. A result letter of this
screening mammogram will be mailed directly to the patient.

RECOMMENDATION:
Screening mammogram in one year. (Code:0E-3-N98)

BI-RADS CATEGORY  1: Negative.

## 2022-06-06 ENCOUNTER — Encounter (HOSPITAL_BASED_OUTPATIENT_CLINIC_OR_DEPARTMENT_OTHER): Payer: Self-pay | Admitting: Podiatry

## 2022-06-06 NOTE — Progress Notes (Addendum)
Spoke w/ via phone for pre-op interview---[facility] needs dos---- EKG and ISTAT per anesthesia. Surgeon orders pending.              Lab results------ COVID test -----patient states asymptomatic no test needed Arrive at -------0715 NPO after MN NO Solid Food.  Med rec completed Medications to take morning of surgery -----Prilosec Diabetic medication ----- Patient instructed no nail polish to be worn day of surgery Patient instructed to bring photo id and insurance card day of surgery Patient aware to have Driver (ride ) / caregiver Sheryl Reese   for 24 hours after surgery  Patient Special Instructions ----- Pre-Op special Instructions ----- Patient verbalized understanding of instructions that were given at this phone interview. Patient denies shortness of breath, chest pain, fever, cough at this phone interview.  Awaiting H&P patient to see primary care next week.

## 2022-06-09 NOTE — Progress Notes (Addendum)
Received pt's pcp, Dr Lia Hopping, H&P dated 06-08-2022 via fax from Dr Theresia Bough office, placed in chart.  For surgery on 06-16-2022 @ [facility]

## 2022-06-16 ENCOUNTER — Encounter (HOSPITAL_BASED_OUTPATIENT_CLINIC_OR_DEPARTMENT_OTHER)
Admission: RE | Disposition: A | Discharge status: Home / Self Care | Payer: Self-pay | Source: Home / Self Care | Attending: Podiatry

## 2022-06-16 ENCOUNTER — Ambulatory Visit (HOSPITAL_BASED_OUTPATIENT_CLINIC_OR_DEPARTMENT_OTHER): Payer: Medicaid Other

## 2022-06-16 ENCOUNTER — Ambulatory Visit (HOSPITAL_BASED_OUTPATIENT_CLINIC_OR_DEPARTMENT_OTHER): Payer: Medicaid Other | Admitting: Anesthesiology

## 2022-06-16 ENCOUNTER — Ambulatory Visit (HOSPITAL_BASED_OUTPATIENT_CLINIC_OR_DEPARTMENT_OTHER)
Admission: RE | Admit: 2022-06-16 | Discharge: 2022-06-16 | Disposition: A | Discharge status: Home / Self Care | Payer: Medicaid Other | Attending: Podiatry | Admitting: Podiatry

## 2022-06-16 ENCOUNTER — Encounter (HOSPITAL_BASED_OUTPATIENT_CLINIC_OR_DEPARTMENT_OTHER): Payer: Self-pay | Admitting: Podiatry

## 2022-06-16 ENCOUNTER — Other Ambulatory Visit: Payer: Self-pay

## 2022-06-16 DIAGNOSIS — I10 Essential (primary) hypertension: Secondary | ICD-10-CM | POA: Diagnosis not present

## 2022-06-16 DIAGNOSIS — M2011 Hallux valgus (acquired), right foot: Secondary | ICD-10-CM

## 2022-06-16 DIAGNOSIS — M2041 Other hammer toe(s) (acquired), right foot: Secondary | ICD-10-CM | POA: Diagnosis not present

## 2022-06-16 DIAGNOSIS — K219 Gastro-esophageal reflux disease without esophagitis: Secondary | ICD-10-CM | POA: Diagnosis not present

## 2022-06-16 HISTORY — PX: BUNIONECTOMY: SHX129

## 2022-06-16 LAB — POCT I-STAT, CHEM 8
BUN: 14 mg/dL (ref 8–23)
Calcium, Ion: 1.24 mmol/L (ref 1.15–1.40)
Chloride: 95 mmol/L — ABNORMAL LOW (ref 98–111)
Creatinine, Ser: 0.7 mg/dL (ref 0.44–1.00)
Glucose, Bld: 115 mg/dL — ABNORMAL HIGH (ref 70–99)
HCT: 47 % — ABNORMAL HIGH (ref 36.0–46.0)
Hemoglobin: 16 g/dL — ABNORMAL HIGH (ref 12.0–15.0)
Potassium: 3.8 mmol/L (ref 3.5–5.1)
Sodium: 134 mmol/L — ABNORMAL LOW (ref 135–145)
TCO2: 26 mmol/L (ref 22–32)

## 2022-06-16 SURGERY — BUNIONECTOMY
Anesthesia: Monitor Anesthesia Care | Site: Toe | Laterality: Right

## 2022-06-16 MED ORDER — LIDOCAINE 2% (20 MG/ML) 5 ML SYRINGE
INTRAMUSCULAR | Status: DC | PRN
  Administered 2022-06-16: 60 mg via INTRAVENOUS

## 2022-06-16 MED ORDER — MIDAZOLAM HCL 2 MG/2ML IJ SOLN
2.0000 mg | Freq: Once | INTRAMUSCULAR | Status: AC
  Administered 2022-06-16: 1 mg via INTRAVENOUS

## 2022-06-16 MED ORDER — PROPOFOL 500 MG/50ML IV EMUL
INTRAVENOUS | Status: AC
  Filled 2022-06-16: qty 50

## 2022-06-16 MED ORDER — 0.9 % SODIUM CHLORIDE (POUR BTL) OPTIME
TOPICAL | Status: DC | PRN
  Administered 2022-06-16: 500 mL

## 2022-06-16 MED ORDER — FENTANYL CITRATE (PF) 100 MCG/2ML IJ SOLN: 25.0000 ug | INTRAMUSCULAR | Status: DC | PRN

## 2022-06-16 MED ORDER — FENTANYL CITRATE (PF) 100 MCG/2ML IJ SOLN
50.0000 ug | Freq: Once | INTRAMUSCULAR | Status: AC
  Administered 2022-06-16: 50 ug via INTRAVENOUS

## 2022-06-16 MED ORDER — MIDAZOLAM HCL 2 MG/2ML IJ SOLN
INTRAMUSCULAR | Status: AC
  Filled 2022-06-16: qty 2

## 2022-06-16 MED ORDER — OXYCODONE HCL 5 MG/5ML PO SOLN: 5.0000 mg | Freq: Once | ORAL | Status: DC | PRN

## 2022-06-16 MED ORDER — DEXAMETHASONE SODIUM PHOSPHATE 10 MG/ML IJ SOLN
INTRAMUSCULAR | Status: DC | PRN
  Administered 2022-06-16: 10 mg via INTRAVENOUS

## 2022-06-16 MED ORDER — PROPOFOL 500 MG/50ML IV EMUL
INTRAVENOUS | Status: DC | PRN
  Administered 2022-06-16: 125 ug/kg/min via INTRAVENOUS

## 2022-06-16 MED ORDER — LIDOCAINE HCL (PF) 2 % IJ SOLN
INTRAMUSCULAR | Status: AC
  Filled 2022-06-16: qty 5

## 2022-06-16 MED ORDER — ONDANSETRON HCL 4 MG/2ML IJ SOLN
INTRAMUSCULAR | Status: AC
  Filled 2022-06-16: qty 2

## 2022-06-16 MED ORDER — LACTATED RINGERS IV SOLN: INTRAVENOUS | Status: DC

## 2022-06-16 MED ORDER — BUPIVACAINE HCL (PF) 0.5 % IJ SOLN
INTRAMUSCULAR | Status: DC | PRN
  Administered 2022-06-16: 25 mL via PERINEURAL
  Administered 2022-06-16: 15 mL via PERINEURAL

## 2022-06-16 MED ORDER — ONDANSETRON HCL 4 MG/2ML IJ SOLN
INTRAMUSCULAR | Status: DC | PRN
  Administered 2022-06-16: 4 mg via INTRAVENOUS

## 2022-06-16 MED ORDER — CEFAZOLIN SODIUM-DEXTROSE 2-4 GM/100ML-% IV SOLN
2.0000 g | INTRAVENOUS | Status: AC
  Administered 2022-06-16: 2 g via INTRAVENOUS

## 2022-06-16 MED ORDER — CEFAZOLIN SODIUM-DEXTROSE 2-4 GM/100ML-% IV SOLN
INTRAVENOUS | Status: AC
  Filled 2022-06-16: qty 100

## 2022-06-16 MED ORDER — PROPOFOL 10 MG/ML IV BOLUS
INTRAVENOUS | Status: DC | PRN
  Administered 2022-06-16: 20 mg via INTRAVENOUS

## 2022-06-16 MED ORDER — PROPOFOL 1000 MG/100ML IV EMUL
INTRAVENOUS | Status: AC
  Filled 2022-06-16: qty 100

## 2022-06-16 MED ORDER — DEXAMETHASONE SODIUM PHOSPHATE 10 MG/ML IJ SOLN
INTRAMUSCULAR | Status: AC
  Filled 2022-06-16: qty 1

## 2022-06-16 MED ORDER — FENTANYL CITRATE (PF) 100 MCG/2ML IJ SOLN
INTRAMUSCULAR | Status: AC
  Filled 2022-06-16: qty 2

## 2022-06-16 MED ORDER — OXYCODONE HCL 5 MG PO TABS: 5.0000 mg | ORAL_TABLET | Freq: Once | ORAL | Status: DC | PRN

## 2022-06-16 MED ORDER — ONDANSETRON HCL 4 MG/2ML IJ SOLN: 4.0000 mg | Freq: Four times a day (QID) | INTRAMUSCULAR | Status: DC | PRN

## 2022-06-16 SURGICAL SUPPLY — 111 items
APL PRP STRL LF DISP 70% ISPRP (MISCELLANEOUS) ×1
BIT DRILL 1.3 (BIT) ×1
BIT DRILL 1.7 LNG CANN (DRILL) IMPLANT
BIT DRILL 100X1.3XAO CNCT (BIT) IMPLANT
BIT DRILL 2 FENESTRATED (MISCELLANEOUS) IMPLANT
BIT DRILL 2.4X140 LONG SOLID (BIT) IMPLANT
BIT DRILL CANNULTD 2.6 X 130MM (DRILL) IMPLANT
BIT DRILLL 2 FENESTRATED (MISCELLANEOUS) ×1
BIT DRL 100X1.3XAO CNCT (BIT) ×1
BLADE AVERAGE 25X9 (BLADE) IMPLANT
BLADE MINI RND TIP GREEN BEAV (BLADE) IMPLANT
BLADE OSC/SAG .038X5.5 CUT EDG (BLADE) IMPLANT
BLADE OSCILLATING/SAGITTAL (BLADE)
BLADE SAW 9X31X.051 (BLADE) IMPLANT
BLADE SURG 15 STRL LF DISP TIS (BLADE) ×2 IMPLANT
BLADE SURG 15 STRL SS (BLADE) ×4
BLADE SW THK.38XMED LNG THN (BLADE) ×2 IMPLANT
BNDG CMPR 5X3 KNIT ELC UNQ LF (GAUZE/BANDAGES/DRESSINGS) ×1
BNDG CMPR 5X4 KNIT ELC UNQ LF (GAUZE/BANDAGES/DRESSINGS) ×1
BNDG CMPR 75X21 PLY HI ABS (MISCELLANEOUS) ×1
BNDG CMPR 9X4 STRL LF SNTH (GAUZE/BANDAGES/DRESSINGS) ×1
BNDG CMPR STD VLCR NS LF 5.8X4 (GAUZE/BANDAGES/DRESSINGS) ×1
BNDG CMPR THK2 5X4 CHSV NS (GAUZE/BANDAGES/DRESSINGS) ×1
BNDG COHESIVE 4X5 TAN NS LF (GAUZE/BANDAGES/DRESSINGS) IMPLANT
BNDG ELASTIC 3INX 5YD STR LF (GAUZE/BANDAGES/DRESSINGS) ×2 IMPLANT
BNDG ELASTIC 4INX 5YD STR LF (GAUZE/BANDAGES/DRESSINGS) ×2 IMPLANT
BNDG ELASTIC 4X5.8 VLCR NS LF (GAUZE/BANDAGES/DRESSINGS) IMPLANT
BNDG ESMARK 4X9 LF (GAUZE/BANDAGES/DRESSINGS) ×2 IMPLANT
BNDG GAUZE DERMACEA FLUFF 4 (GAUZE/BANDAGES/DRESSINGS) ×2 IMPLANT
BNDG GZE DERMACEA 4 6PLY (GAUZE/BANDAGES/DRESSINGS) ×1
CHLORAPREP W/TINT 26 (MISCELLANEOUS) ×2 IMPLANT
COUNTERSICK 4.0 HEADED (MISCELLANEOUS) ×1
COVER BACK TABLE 60X90IN (DRAPES) ×2 IMPLANT
COVER K-WIRE PIN .062/1.6 (PIN)
CUFF TOURN SGL QUICK 18X4 (TOURNIQUET CUFF) IMPLANT
CUFF TOURN SGL QUICK 24 (TOURNIQUET CUFF) ×1
CUFF TRNQT CYL 24X4X16.5-23 (TOURNIQUET CUFF) IMPLANT
DRAPE 3/4 80X56 (DRAPES) ×2 IMPLANT
DRAPE C-ARM 35X43 STRL (DRAPES) IMPLANT
DRAPE EXTREMITY T 121X128X90 (DISPOSABLE) ×2 IMPLANT
DRAPE U-SHAPE 47X51 STRL (DRAPES) ×2 IMPLANT
DRILL CANNULATED 2.6 X 130MM (DRILL) ×2
ELECT REM PT RETURN 9FT ADLT (ELECTROSURGICAL) ×1
ELECTRODE REM PT RTRN 9FT ADLT (ELECTROSURGICAL) ×2 IMPLANT
GAUZE 4X4 16PLY ~~LOC~~+RFID DBL (SPONGE) ×2 IMPLANT
GAUZE SPONGE 4X4 12PLY STRL (GAUZE/BANDAGES/DRESSINGS) ×2 IMPLANT
GAUZE STRETCH 2X75IN STRL (MISCELLANEOUS) IMPLANT
GAUZE XEROFORM 1X8 LF (GAUZE/BANDAGES/DRESSINGS) ×2 IMPLANT
GLOVE BIO SURGEON STRL SZ7.5 (GLOVE) ×2 IMPLANT
GLOVE BIO SURGEON STRL SZ8 (GLOVE) ×2 IMPLANT
GLOVE BIOGEL PI IND STRL 8 (GLOVE) ×2 IMPLANT
GOWN STRL REUS W/TWL 2XL LVL3 (GOWN DISPOSABLE) ×2 IMPLANT
GUIDE WIRE 1.6 MM (WIRE) ×2
GUIDEWIRE 1.6 MM (WIRE) IMPLANT
HYDROGEN PEROXIDE 16OZ (MISCELLANEOUS) IMPLANT
K-WIRE CAPS STERILE WHITE .045 (WIRE) IMPLANT
K-WIRE DBL BLUNT SMTH 1.6X150 (Wire) ×1 IMPLANT
K-WIRE DBL END TROCAR 6X.045 (WIRE) ×1
K-WIRE SMOOTH TROCAR 2.0X150 (WIRE) ×3
K-WIRE SNGL END 1.2X150 (MISCELLANEOUS) ×2
K-WIRE SURGICAL 1.6X102 (WIRE) IMPLANT
K-Wire IMPLANT
KIT TURNOVER CYSTO (KITS) ×2 IMPLANT
KWIRE DBL BLUNT SMTH 1.6X150 (Wire) IMPLANT
KWIRE DBL END TROCAR 6X.045 (WIRE) IMPLANT
KWIRE SMOOTH TROCAR 2.0X150 (WIRE) IMPLANT
KWIRE SNGL END 1.2X150 (MISCELLANEOUS) IMPLANT
NDL HYPO 25X1 1.5 SAFETY (NEEDLE) ×2 IMPLANT
NEEDLE HYPO 25X1 1.5 SAFETY (NEEDLE) ×1 IMPLANT
NS IRRIG 500ML POUR BTL (IV SOLUTION) ×2 IMPLANT
PACK BASIN DAY SURGERY FS (CUSTOM PROCEDURE TRAY) ×2 IMPLANT
PENCIL SMOKE EVACUATOR (MISCELLANEOUS) ×2 IMPLANT
PIN GUARD 1.57MM GREEN STERILE (PIN) IMPLANT
PLATE 4HOLE MINI LOCK (Plate) IMPLANT
PLATE STD RIGHT LAPIDUS (Plate) IMPLANT
SCREW 3.5X16 NONLOCKING (Screw) IMPLANT
SCREW CANN 4.0X38 SHORT THREAD (Screw) IMPLANT
SCREW CANN HDLS ST 2.0X30 (Screw) IMPLANT
SCREW COUNTERSINK 4.0 HEADED (MISCELLANEOUS) IMPLANT
SCREW LOCK PLATE R3 3.5X14 (Screw) IMPLANT
SCREW LOCK PLATE R3 3.5X16 (Screw) IMPLANT
SCREW LOCKING 2.0X12 (Screw) IMPLANT
SCREW N-LOCK 2.0X14 (Screw) IMPLANT
SLEEVE SCD COMPRESS KNEE MED (STOCKING) ×2 IMPLANT
STANDARD R LAPIDUS PLATE (Plate) ×1 IMPLANT
STAPLER VISISTAT 35W (STAPLE) IMPLANT
STIMULATOR BONE (ORTHOPEDIC SUPPLIES) ×1
STIMULATOR BONE GROWTH EMG EXT (ORTHOPEDIC SUPPLIES) IMPLANT
STOCKINETTE 6  STRL (DRAPES) ×1
STOCKINETTE 6 STRL (DRAPES) ×2 IMPLANT
SUCTION FRAZIER HANDLE 10FR (MISCELLANEOUS) ×1
SUCTION TUBE FRAZIER 10FR DISP (MISCELLANEOUS) ×2 IMPLANT
SUT ETHILON 4 0 PS 2 18 (SUTURE) IMPLANT
SUT MNCRL AB 3-0 PS2 27 (SUTURE) IMPLANT
SUT MNCRL AB 4-0 PS2 18 (SUTURE) ×2 IMPLANT
SUT MON AB 5-0 PS2 18 (SUTURE) IMPLANT
SUT VIC AB 2-0 PS2 27 (SUTURE) IMPLANT
SUT VIC AB 2-0 SH 27 (SUTURE) ×1
SUT VIC AB 2-0 SH 27XBRD (SUTURE) IMPLANT
SUT VIC AB 3-0 FS2 27 (SUTURE) IMPLANT
SUT VIC AB 4-0 PS2 27 (SUTURE) IMPLANT
SYR BULB EAR ULCER 3OZ GRN STR (SYRINGE) ×2 IMPLANT
SYR CONTROL 10ML LL (SYRINGE) ×2 IMPLANT
TOWEL OR 17X24 6PK STRL BLUE (TOWEL DISPOSABLE) ×2 IMPLANT
TRAY DSU PREP LF (CUSTOM PROCEDURE TRAY) IMPLANT
UNDERPAD 30X36 HEAVY ABSORB (UNDERPADS AND DIAPERS) ×2 IMPLANT
WIRE OLIVE SMOOTH 1.4MMX60MM (WIRE) IMPLANT
WIRE SMOOTH TROCAR .9MMX150MML (WIRE) IMPLANT
YANKAUER SUCT BULB TIP NO VENT (SUCTIONS) IMPLANT
drill IMPLANT
paragon saw blade IMPLANT

## 2022-06-16 NOTE — H&P (Signed)
  History and Physical Interval Note:  06/16/2022 8:47 AM  Sheryl Reese  has presented today for surgery, with the diagnosis of Hallux valgus, hammertoe, metatarsal deformity.  The various methods of treatment have been discussed with the patient and family. After consideration of risks, benefits and other options for treatment, the patient has consented to   Procedure(s): LAPIDUS BUNIONECTOMY (Right) metatarsal osteotomy and hammertoe correction of the second toe as a surgical intervention.  The patient's history has been reviewed, patient examined, no change in status, stable for surgery.  I have reviewed the patient's chart and labs.  Questions were answered to the patient's satisfaction.     Rechel Delosreyes Paulla Dolly

## 2022-06-16 NOTE — Anesthesia Procedure Notes (Signed)
Anesthesia Regional Block: Adductor canal block   Pre-Anesthetic Checklist: , timeout performed,  Correct Patient, Correct Site, Correct Laterality,  Correct Procedure, Correct Position, site marked,  Risks and benefits discussed,  Surgical consent,  Pre-op evaluation,  At surgeon's request and post-op pain management  Laterality: Right  Prep: chloraprep       Needles:  Injection technique: Single-shot  Needle Type: Echogenic Needle     Needle Length: 9cm  Needle Gauge: 21     Additional Needles:   Narrative:  Start time: 06/16/2022 8:27 AM End time: 06/16/2022 8:31 AM Injection made incrementally with aspirations every 5 mL.  Performed by: Personally  Anesthesiologist: Achille Rich, MD  Additional Notes: Pt tolerated the procedure well.

## 2022-06-16 NOTE — Anesthesia Preprocedure Evaluation (Signed)
Anesthesia Evaluation  Patient identified by MRN, date of birth, ID band Patient awake    Reviewed: Allergy & Precautions, H&P , NPO status , Patient's Chart, lab work & pertinent test results  Airway Mallampati: II   Neck ROM: full    Dental   Pulmonary neg pulmonary ROS   breath sounds clear to auscultation       Cardiovascular hypertension,  Rhythm:regular Rate:Normal     Neuro/Psych    GI/Hepatic ,GERD  ,,  Endo/Other    Renal/GU      Musculoskeletal   Abdominal   Peds  Hematology   Anesthesia Other Findings   Reproductive/Obstetrics                             Anesthesia Physical Anesthesia Plan  ASA: 2  Anesthesia Plan: MAC and Regional   Post-op Pain Management:    Induction: Intravenous  PONV Risk Score and Plan: 2 and Ondansetron, Propofol infusion, Midazolam and Treatment may vary due to age or medical condition  Airway Management Planned: Simple Face Mask  Additional Equipment:   Intra-op Plan:   Post-operative Plan:   Informed Consent: I have reviewed the patients History and Physical, chart, labs and discussed the procedure including the risks, benefits and alternatives for the proposed anesthesia with the patient or authorized representative who has indicated his/her understanding and acceptance.     Dental advisory given  Plan Discussed with: CRNA, Anesthesiologist and Surgeon  Anesthesia Plan Comments:        Anesthesia Quick Evaluation

## 2022-06-16 NOTE — Progress Notes (Signed)
Orthopedic Tech Progress Note Patient Details:  Sheryl Reese 05-09-1960 [id number]  Ortho Devices Type of Ortho Device: CAM walker Ortho Device/Splint Location: RLE Ortho Device/Splint Interventions: Application   Post Interventions Patient Tolerated: Well  [name] E [name] 06/16/2022, 1:01 PM

## 2022-06-16 NOTE — Discharge Instructions (Addendum)
Post-Operative Discharge Instructions  Patient name: Sheryl Reese MRN: [id number] DOB: 26-Dec-1960  Date of Surgery: 06/16/2022            Location: [facility]  Follow up appointment:  Next week  On the Day of Surgery: Please pick up prescription(s) prior to the morning of surgery.  Please go directly to the couch or bed and keep your feet elevated by putting two pillows under your feet and one pillow under your knees. Keep your feet out from under the blanket.   Discomfort and Swelling: Your foot may be numb for the remainder of the day. Swelling is expected. In some cases, the skin of the foot or the leg may take on a bruised, black and blue appearance.   Temperature: Take your temperature on the 2nd, 3rd, and 4th day after your surgery at 5:00pm. If your temperature is above 101 degrees, please call the physician's office.   Bleeding: A slight amount of drainage on the dressing is normal. Resting your foot in an elevated position will limit bleeding. If bleeding continues, wrap a towel around your foot and apply an ice pack.   Dressing: Keep the bandage absolutely dry and DO NOT remove the dressing.  If the dressing becomes wet or soiled, call your physician's office immediately.   Stitches: The stitches will remain in place for about two weeks, depending on the nature of your operation. Slight pulling sensation may be felt due to the stitches. This is a normal occurrence.   Ice: Apply a well-sealed ice pack to your foot or behind your knee for 20 minutes out of every hour for the first 24 hours after your surgery. DO NOT leave the ice pack in place at bedtime or during long naps. Do not place an ice pack directly on skin, place a washcloth between the ice pack and the dressing. This will help avoid getting the dressing wet, and help avoid any ice burn.  Weight Bearing Status:  Non-weight bearing     Shoes: Wear your post-operative shoe or boot anytime you put weight  on your feet, unless noted above. DO NOT DRIVE WITH BOOT ON RIGHT FOOT.  Diet: Return to your regular diet slowly within 24 hours. If you received sedation, your first meal should be light. Do not eat greasy or spicy foods for the first meal. Drink large quantities of liquids, especially water, citrus and other fruit juices. Please call if you're unable to keep fluids down. Eat food with your medication.  General Activities: Recovery is a gradual process; however you should feel better with each passing day. The first day, leave the bed only to go to the bathroom. Gradually increase your activity after the first day. For the first week or two, resting each day is important. Strenuous work, heavy lifting and excessive social activities should be avoided. Rest. Ice. Elevate.  Postoperative Care: The post-operative care periods lasts for approximately 6-12 weeks. During this time, periodic visits to your physician's office will be required so your healing process can be monitored closely. It is essential for your recovery that you continue to be monitored by your physician until you are discharged and completely healed. Care during this time is the most important part of your recovery process.   Recovery from anesthesia: You may feel drowsy and reflexes may be slowed for 24 hours. Do not drive, use machinery, appliances, ride bicycles, or scooter. Do not make important decisions.   Complications: Please call your physician  following surgery if you have any of the following complications:          1.)  Severe pain unrelieved by medication   2.) Excessive heavy or prolonged bleeding   3.) Dizziness or fainting   4.) Soiled or wet dressings   5.) Temperature over 101.0 degrees             Post Anesthesia Home Care Instructions  Activity: Get plenty of rest for the remainder of the day. A responsible individual must stay with you for 24 hours following the procedure.  For the next 24 hours, DO  NOT: -Drive a car -Advertising copywriter -Drink alcoholic beverages -Take any medication unless instructed by your physician -Make any legal decisions or sign important papers.  Meals: Start with liquid foods such as gelatin or soup. Progress to regular foods as tolerated. Avoid greasy, spicy, heavy foods. If nausea and/or vomiting occur, drink only clear liquids until the nausea and/or vomiting subsides. Call your physician if vomiting continues.  Special Instructions/Symptoms: Your throat may feel dry or sore from the anesthesia or the breathing tube placed in your throat during surgery. If this causes discomfort, gargle with warm salt water. The discomfort should disappear within 24 hours.        Regional Anesthesia Blocks  1. Numbness or the inability to move the "blocked" extremity may last from 3-48 hours after placement. The length of time depends on the medication injected and your individual response to the medication. If the numbness is not going away after 48 hours, call your surgeon.  2. The extremity that is blocked will need to be protected until the numbness is gone and the  Strength has returned. Because you cannot feel it, you will need to take extra care to avoid injury. Because it may be weak, you may have difficulty moving it or using it. You may not know what position it is in without looking at it while the block is in effect.  3. For blocks in the legs and feet, returning to weight bearing and walking needs to be done carefully. You will need to wait until the numbness is entirely gone and the strength has returned. You should be able to move your leg and foot normally before you try and bear weight or walk. You will need someone to be with you when you first try to ensure you do not fall and possibly risk injury.  4. Bruising and tenderness at the needle site are common side effects and will resolve in a few days.  5. Persistent numbness or new problems with movement  should be communicated to the surgeon or the [facility] [phone] [facility] [phone]).

## 2022-06-16 NOTE — Progress Notes (Signed)
Assisted Dr. Chaney Malling with right, adductor canal, popliteal/saphenous, ultrasound guided block. Side rails up, monitors on throughout procedure. See vital signs in flow sheet. Tolerated Procedure well.

## 2022-06-16 NOTE — Anesthesia Procedure Notes (Signed)
Anesthesia Regional Block: Popliteal block   Pre-Anesthetic Checklist: , timeout performed,  Correct Patient, Correct Site, Correct Laterality,  Correct Procedure, Correct Position, site marked,  Risks and benefits discussed,  Surgical consent,  Pre-op evaluation,  At surgeon's request and post-op pain management  Laterality: Right  Prep: chloraprep       Needles:  Injection technique: Single-shot  Needle Type: Echogenic Stimulator Needle          Additional Needles:   Procedures:, nerve stimulator,,,,,     Nerve Stimulator or Paresthesia:  Response: plantar flexion of foot, 0.45 mA  Additional Responses:   Narrative:  Start time: 06/16/2022 8:20 AM End time: 06/16/2022 8:26 AM Injection made incrementally with aspirations every 5 mL.  Performed by: Personally  Anesthesiologist: Achille Rich, MD  Additional Notes: Functioning IV was confirmed and monitors were applied.  A 90mm 21ga Arrow echogenic stimulator needle was used. Sterile prep and drape,hand hygiene and sterile gloves were used.  Negative aspiration and negative test dose prior to incremental administration of local anesthetic. The patient tolerated the procedure well.  Ultrasound guidance: relevent anatomy identified, needle position confirmed, local anesthetic spread visualized around nerve(s), vascular puncture avoided.  Image printed for medical record.

## 2022-06-16 NOTE — Progress Notes (Signed)
Orthopedic Tech Progress Note Patient Details:  Sheryl Reese 01/05/1961 [id number] Soiled CAM Dan Humphreys was replaced with a new one. Patient also has a device to aid bone growth that is difficult to use with the CAM Walker so lower straps were removed.  Ortho Devices Type of Ortho Device: CAM walker Ortho Device/Splint Location: RLE Ortho Device/Splint Interventions: Application   Post Interventions Patient Tolerated: Well  [name] 06/16/2022, 2:06 PM

## 2022-06-16 NOTE — Transfer of Care (Signed)
Immediate Anesthesia Transfer of Care Note  Patient: Sheryl Reese  Procedure(s) Performed: Silas Flood PHALANGEAL OSTEOTOMY, HAMMERTOE REPAIR RIGHT SECOND TOE, LESSER METATARSAL OSTEOTOMY RIGHT SECOND METATARSAL, APPLICATION OF BONE STIMULATOR (Right: Toe)  Patient Location: PACU  Anesthesia Type:MAC and Regional  Level of Consciousness: awake, alert , and oriented  Airway & Oxygen Therapy: Patient Spontanous Breathing and Patient connected to face mask oxygen  Post-op Assessment: Report given to RN and Post -op Vital signs reviewed and stable  Post vital signs: Reviewed and stable  Last Vitals:  Vitals Value Taken Time  BP 139/85 06/16/22 1142  Temp    Pulse 105 06/16/22 1144  Resp 18 06/16/22 1144  SpO2 99 % 06/16/22 1144  Vitals shown include unvalidated device data.  Last Pain:  Vitals:   06/16/22 0742  TempSrc: Oral  PainSc: 0-No pain      Patients Stated Pain Goal: 5 (06/16/22 0742)  Complications: No notable events documented.

## 2022-06-16 NOTE — Progress Notes (Signed)
Once patient got settled in Phase II, she had a moderate amount of sanguinous drainage from surgical toe. Patient had no drainage in PACU. Dr Theresia Bough came to evaluate and reinforced dressing. Orders to keep patient's foot elevated for another hour prior to discharge. Once the hour was over, patient transferred from recliner to wheelchair and from wheelchair to car with no further bleeding. Patient given gauze to take home and reinforce dressing as needed.

## 2022-06-17 NOTE — Op Note (Signed)
OPERATIVE REPORT Patient name: Sheryl Reese MRN: [id number] DOB: 1960/12/05  DOS:  06/17/2022  Preop Dx: Hypertension, unspecified type - Plan: EKG 12 lead per protocol, EKG 12 lead per protocol, CANCELED: I-Stat, Chem 8 on day of surgery per protocol, CANCELED: I-Stat, Chem 8 on day of surgery per protocol Postop Dx: same  Procedure:  1. Lapidus bunionectomy right 2. Akin osteotomy right 3. Lesser metatarsal shortening osteotomy right 4. Second digit hammertoe correction right  Surgeon: Hinton Rao, DPM  Anesthesia: Popliteal block per anesthesia team.  Hemostasis: Calf tourniquet inflated to a pressure of after esmarch exsanguination   EBL: 20 mL Materials: Paragon lapidus plate and screw system, 2-0 paragon screw 28 mm. Paragon mini-monster plate and 4, paragon 2-0 locking screws. Injectables: None Pathology: None  Condition: The patient tolerated the procedure and anesthesia well. No complications noted or reported   Justification for procedure: The patient is a 61 y.o. Female who presents today for surgical correction of Hallux valgus and hammertoe. All conservative modalities of been unsuccessful in providing any sort of satisfactory alleviation of symptoms with the patient. The patient was identified preoperatively and the correct operative extremity was marked by myself. Patient was educated pre-operatively on risks, complications, and alternatives. The patient consented for surgical correction. The patient consent form was reviewed. All patient questions were answered. No guarantees were expressed or implied. The patient and the surgeon both signed the patient consent form with the witness present and placed in the patient's chart.   Procedure in Detail: The patient was brought to the operating room, placed on the operating table in a supine position and anesthesia was induced. The right Lower extremity was prepped and draped in a standard sterile fashion and a  timeout was performed to verify the correct operative site. Attention was then directed to the surgical area where procedure number one commenced.  Procedure #1: Right Lapidus Arthrodesis of First Metatarsal Cuneiform Joint CPT [id number]:  Attention was directed over the dorso-medial aspect of the first tarsometatarsal joint where a linear, longitudinal incision was made, medial to the extensor hallucis longus tendon, starting proximal to the metatarsal-cuneiform joint and extending distally to about the midshaft of the first metatarsal. Dissection was carried down to the first metatarsal-cuneiform joint. A linear capsulotomy was performed over the first metatarsal-cuneiform joint, exposing both sides of the joint. Two steinmen pins were utilized to distract the first metatarsal-cuneiform joint with the aid of a hinterman retractor. A bone saw and power rasp was used to resect both cartilaginous sides of the joint. The wound was then flushed with normal sterile saline. Once the joint was prepared with subchondral drilling and osteotome fish-scaling, the hinterman retractor and Steinman pins were removed. A steinmen  pin was then introduced into the midshaft of the first metatarsal and was utilized to rotate the metatarsal out of valgus. A weber clamp was then utilized to obtain reduction of the first and second intermetatarsal angle. Initial compression of the joint was obtained manually with dorsiflexion of the great toe. Once the reduction maneuver was adequately made, A steinmen pin was utilized to temporarily hold the reduction. This reduction was checked under C-arm. A Pargon plate of appropriate size was then tacked down using olive wires. Alignment was checked using C-arm. Once I was happy with plate placement, the plate was tacked down temporarily with olive wires.  utilizing a guidewire, a 4.0-mm partially threaded lag screw was placed across the fusion site from proximal to distal. Attention was directed  to the Talonavicular joint to assure this screw did not violate the joint. Good compression was noted across the joint. The proximal locking screws were next drilled and filled with appropriately sized screws into the metatarsal. Olive wires and Steinman pins were then removed from the plate and the plate was then secured distally in the first metatarsal with a nonlocking and locking screw making sure to adequately drill and fill the screws. A C-arm was used to verify proper positioning of all the screws and plate. Good correction was noted following the arthrodesis procedure. Sesamoids appear to have improved appearance under the first metatarsal head in proper anatomic position.   The intercuneiform joints were stressed and determined to be competent   [Attention was then directed to the over the first metatarsophalangeal joint, where a separate incision was carefully deepened through subcutaneous tissues.  Any bleeders noted were cauterized as necessary. The dorsal medial bony prominence was noted and resected utilizing a bone rasp.]   C-arm used to verify proper positioning of the screws and plate.  Procedure 2: Akin Osteotomy  A dorsal medial incision at the level of the hallux proximal phalanx midshaft was created.  The incision was carefully deepened down through the soft tissues to the level of bone. Then the saw blade was introduced through this incision and an oblique osteotomy was made through the shaft of the proximal phalanx making sure to preserve a lateral hinge of bone on the proximal phalanx. Then the osteotomy was closed down to achieve the required correction. A guide pin for a headless compression screw was then inserted obliquely perpendicular to the osteotomy site. The position for the guidepin was verified utilizing intraoperative fluoroscopy. A small incision was then made overlying the guide pin taking care to mobilize and retract the subcutaneous neurovascular structures. The guide  pin was overreamed with the cannulated drill bit and and the screw was then placed across the osteotomy site from distal to proximal. The position of the hardware as well as the toe was then verified utilizing intraoperative fluoroscopy. At the completion of the procedure the toe position was adequately reduced.  Procedure 3: Lesser metatarsal shortening osteotomy with  Metatarsal phalangeal Joint Release CPT 28308  Contracture and an elongated plantarly prominent metatarsal head was noted at the 2nd metatarsophalangeal joint. An incision was made overlying the affected metatarsophalangeal joint and dissection was made down to the joint releasing the capsule on both sides of the joint and releasing the extensor brevis tendon. The periosteum was excised from the dorsum of the metatarsal and a saggittal saw was utilized to make an osteotomy perpendicular to the long axis of the bone 12 mm from the articular cartilage. Next a 3 mm wedge of bone was removed from the osteotomy site in order to shorten the 2nd metatarsal. Next a small 4 hole locking plate was placed over the osteotomy site and placement was confirmed on fluoroscopy. The capital fragment was then translated proximally and the correction was checked under fluoroscopy. When the appropriate alignment was achieved, the position was held in place with manual compression and 4,  2.0 locking screws was placed in the plate with 2 screws in the proximal metatarsal and 2 screws in the distal metatarsal to hold the reduction. The alignment was verified with fluoroscopy.The digit was able to now lay in a straight manner and the plantar prominence at the second metatarsal was noted to be much improved.  Procedure 4: Interphalangeal Joint Arthrodesis CPT 28285:  We then directed our  attention to the 2nd digit hammertoe.  An incision was made over the dorsal aspect of the digit from just proximal the the proximal interphalangeal joint and extending proximally  across the Metatarsophalangeal joint.  The incision was carefully deepened through subcutaneous tissues.  Any bleeders noted were cauterized as necessary.  Both a transverse extensor tenotomy and capsulotomy was performed over the proximal interphalangeal joint reflecting the tissues and exposing both sides of the joint, which were then carefully resected utilizing oscillating saw blade and rongeur.  At this time the guide wire for a 0.062 k wire was then introduced to the base of the proximal phalanx, driving it out of the toe then retrograded back across the proximal phalanx, keeping compression to the joint and keeping the toe in its appropriate position.  The area was irrigated with copious amounts of normal sterile saline.  Next each surgical site was closed with 3-0 vicryl and 4-0 nylon sutures.  Dry sterile compressive dressings were then applied to all previously mentioned incision sites about the patient's lower extremity. The tourniquet which was used for hemostasis was deflated. All normal neurovascular responses including pink color and warmth returned all the digits of patient's lower extremity.  An orthofix bone stimulator was applied over the dressing in order to promote stimulation of bone healing.  The patient was then transferred from the operating room to the recovery room having tolerated the procedure and anesthesia well. All vital signs are stable. After a brief stay in the recovery room the patient was discharged home with adequate prescriptions for analgesia. Verbal as well as written instructions were provided for the patient regarding wound care. The patient is to keep the dressings clean dry and intact until they are to follow surgeon Dr. Hinton Rao, DPM in the office upon discharge.   Hinton Rao DPM

## 2022-06-19 NOTE — Anesthesia Postprocedure Evaluation (Signed)
Anesthesia Post Note  Patient: Sheryl Reese  Procedure(s) Performed: Silas Flood PHALANGEAL OSTEOTOMY, HAMMERTOE REPAIR RIGHT SECOND TOE, LESSER METATARSAL OSTEOTOMY RIGHT SECOND METATARSAL, APPLICATION OF BONE STIMULATOR (Right: Toe)     Patient location during evaluation: PACU Anesthesia Type: Regional and MAC Level of consciousness: awake and alert Pain management: pain level controlled Vital Signs Assessment: post-procedure vital signs reviewed and stable Respiratory status: spontaneous breathing, nonlabored ventilation, respiratory function stable and patient connected to nasal cannula oxygen Cardiovascular status: stable and blood pressure returned to baseline Postop Assessment: no apparent nausea or vomiting Anesthetic complications: no   No notable events documented.  Last Vitals:  Vitals:   06/16/22 1245 06/16/22 1400  BP: (!) 156/93 (!) 163/94  Pulse: 87   Resp: 15 16  Temp:  36.7 C  SpO2: 93% 93%    Last Pain:  Vitals:   06/16/22 1400  TempSrc:   PainSc: 0-No pain                 Sheryl Reese S

## 2022-06-21 ENCOUNTER — Encounter (HOSPITAL_BASED_OUTPATIENT_CLINIC_OR_DEPARTMENT_OTHER): Payer: Self-pay | Admitting: Podiatry

## 2023-03-09 ENCOUNTER — Other Ambulatory Visit (HOSPITAL_COMMUNITY): Payer: Self-pay | Admitting: Internal Medicine

## 2023-03-09 DIAGNOSIS — Z1231 Encounter for screening mammogram for malignant neoplasm of breast: Secondary | ICD-10-CM

## 2023-05-17 ENCOUNTER — Ambulatory Visit (HOSPITAL_COMMUNITY)
Admission: RE | Admit: 2023-05-17 | Discharge: 2023-05-17 | Disposition: A | Discharge status: Home / Self Care | Payer: Medicaid Other | Source: Ambulatory Visit | Attending: Internal Medicine | Admitting: Internal Medicine

## 2023-05-17 ENCOUNTER — Encounter (HOSPITAL_COMMUNITY): Payer: Self-pay

## 2023-05-17 DIAGNOSIS — Z1231 Encounter for screening mammogram for malignant neoplasm of breast: Secondary | ICD-10-CM | POA: Diagnosis present

## 2023-06-20 ENCOUNTER — Other Ambulatory Visit: Payer: Self-pay | Admitting: Nurse Practitioner

## 2023-06-20 DIAGNOSIS — Z13820 Encounter for screening for osteoporosis: Secondary | ICD-10-CM

## 2023-06-26 ENCOUNTER — Ambulatory Visit (HOSPITAL_COMMUNITY)
Admission: RE | Admit: 2023-06-26 | Discharge: 2023-06-26 | Disposition: A | Discharge status: Home / Self Care | Source: Ambulatory Visit | Attending: Nurse Practitioner | Admitting: Nurse Practitioner

## 2023-06-26 DIAGNOSIS — Z13820 Encounter for screening for osteoporosis: Secondary | ICD-10-CM | POA: Insufficient documentation

## 2023-06-26 DIAGNOSIS — Z780 Asymptomatic menopausal state: Secondary | ICD-10-CM | POA: Diagnosis present

## 2023-07-05 ENCOUNTER — Ambulatory Visit (HOSPITAL_COMMUNITY)
Admission: RE | Admit: 2023-07-05 | Discharge: 2023-07-05 | Disposition: A | Discharge status: Home / Self Care | Source: Ambulatory Visit | Attending: Internal Medicine | Admitting: Internal Medicine

## 2023-07-05 ENCOUNTER — Other Ambulatory Visit (HOSPITAL_COMMUNITY): Payer: Self-pay | Admitting: Internal Medicine

## 2023-07-05 DIAGNOSIS — R059 Cough, unspecified: Secondary | ICD-10-CM

## 2023-10-23 DIAGNOSIS — I10 Essential (primary) hypertension: Secondary | ICD-10-CM | POA: Diagnosis not present

## 2023-10-23 DIAGNOSIS — E7849 Other hyperlipidemia: Secondary | ICD-10-CM | POA: Diagnosis not present

## 2023-10-23 DIAGNOSIS — K219 Gastro-esophageal reflux disease without esophagitis: Secondary | ICD-10-CM | POA: Diagnosis not present

## 2023-10-23 DIAGNOSIS — J4520 Mild intermittent asthma, uncomplicated: Secondary | ICD-10-CM | POA: Diagnosis not present

## 2023-10-23 DIAGNOSIS — L570 Actinic keratosis: Secondary | ICD-10-CM | POA: Diagnosis not present

## 2023-11-06 DIAGNOSIS — M2041 Other hammer toe(s) (acquired), right foot: Secondary | ICD-10-CM | POA: Diagnosis not present

## 2023-11-06 DIAGNOSIS — M2011 Hallux valgus (acquired), right foot: Secondary | ICD-10-CM | POA: Diagnosis not present

## 2023-11-28 DIAGNOSIS — H01004 Unspecified blepharitis left upper eyelid: Secondary | ICD-10-CM | POA: Diagnosis not present

## 2023-11-28 DIAGNOSIS — H40053 Ocular hypertension, bilateral: Secondary | ICD-10-CM | POA: Diagnosis not present

## 2023-11-28 DIAGNOSIS — H01001 Unspecified blepharitis right upper eyelid: Secondary | ICD-10-CM | POA: Diagnosis not present

## 2023-11-28 DIAGNOSIS — H01002 Unspecified blepharitis right lower eyelid: Secondary | ICD-10-CM | POA: Diagnosis not present

## 2023-12-25 DIAGNOSIS — H40053 Ocular hypertension, bilateral: Secondary | ICD-10-CM | POA: Diagnosis not present
